# Patient Record
Sex: Male | Born: 1995 | Race: Black or African American | Hispanic: No | Marital: Single | Smoking: Never smoker
Health system: Southern US, Community
[De-identification: ages and names within clinical notes are randomized; demographics above are authoritative.]

## PROBLEM LIST (undated history)

## (undated) DIAGNOSIS — F209 Schizophrenia, unspecified: Secondary | ICD-10-CM

---
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
## 2016-04-19 ENCOUNTER — Ambulatory Visit: Payer: BLUE CROSS/BLUE SHIELD

## 2019-05-09 ENCOUNTER — Ambulatory Visit: Payer: BLUE CROSS/BLUE SHIELD | Admitting: Registered Nurse

## 2019-05-11 ENCOUNTER — Encounter (HOSPITAL_COMMUNITY): Payer: Self-pay | Admitting: Emergency Medicine

## 2019-05-11 ENCOUNTER — Other Ambulatory Visit: Payer: Self-pay

## 2019-05-11 ENCOUNTER — Emergency Department (HOSPITAL_COMMUNITY)
Admission: EM | Admit: 2019-05-11 | Discharge: 2019-05-12 | Disposition: A | Discharge status: Other Acute Inpatient Hospital | Payer: Self-pay | Attending: Emergency Medicine | Admitting: Emergency Medicine

## 2019-05-11 DIAGNOSIS — R45850 Homicidal ideations: Secondary | ICD-10-CM | POA: Insufficient documentation

## 2019-05-11 DIAGNOSIS — F22 Delusional disorders: Secondary | ICD-10-CM

## 2019-05-11 DIAGNOSIS — Z20828 Contact with and (suspected) exposure to other viral communicable diseases: Secondary | ICD-10-CM | POA: Insufficient documentation

## 2019-05-11 DIAGNOSIS — F200 Paranoid schizophrenia: Secondary | ICD-10-CM | POA: Insufficient documentation

## 2019-05-11 LAB — BASIC METABOLIC PANEL
Anion gap: 9 (ref 5–15)
BUN: 10 mg/dL (ref 6–20)
CO2: 23 mmol/L (ref 22–32)
Calcium: 9.5 mg/dL (ref 8.9–10.3)
Chloride: 107 mmol/L (ref 98–111)
Creatinine, Ser: 1.11 mg/dL (ref 0.61–1.24)
GFR calc Af Amer: >60 mL/min (ref >60)
GFR calc non Af Amer: >60 mL/min (ref >60)
Glucose, Bld: 104 mg/dL — ABNORMAL HIGH (ref 70–99)
Potassium: 3.1 mmol/L — ABNORMAL LOW (ref 3.5–5.1)
Sodium: 139 mmol/L (ref 135–145)

## 2019-05-11 LAB — ETHANOL: Alcohol, Ethyl (B): <10 mg/dL (ref <10)

## 2019-05-11 LAB — RAPID URINE DRUG SCREEN, HOSP PERFORMED
Amphetamines: NOT DETECTED
Barbiturates: NOT DETECTED
Benzodiazepines: NOT DETECTED
Cocaine: NOT DETECTED
Opiates: NOT DETECTED
Tetrahydrocannabinol: NOT DETECTED

## 2019-05-11 LAB — CBC WITH DIFFERENTIAL/PLATELET
Abs Immature Granulocytes: 0.02 10*3/uL (ref 0.00–0.07)
Basophils Absolute: 0 10*3/uL (ref 0.0–0.1)
Basophils Relative: 1 %
Eosinophils Absolute: 0.1 10*3/uL (ref 0.0–0.5)
Eosinophils Relative: 1 %
HCT: 43.6 % (ref 39.0–52.0)
Hemoglobin: 14.5 g/dL (ref 13.0–17.0)
Immature Granulocytes: 0 %
Lymphocytes Relative: 11 %
Lymphs Abs: 0.7 10*3/uL (ref 0.7–4.0)
MCH: 32.2 pg (ref 26.0–34.0)
MCHC: 33.3 g/dL (ref 30.0–36.0)
MCV: 96.9 fL (ref 80.0–100.0)
Monocytes Absolute: 0.9 10*3/uL (ref 0.1–1.0)
Monocytes Relative: 14 %
Neutro Abs: 4.6 10*3/uL (ref 1.7–7.7)
Neutrophils Relative %: 73 %
Platelets: 321 10*3/uL (ref 150–400)
RBC: 4.5 MIL/uL (ref 4.22–5.81)
RDW: 11.6 % (ref 11.5–15.5)
WBC: 6.3 10*3/uL (ref 4.0–10.5)
nRBC: 0 % (ref 0.0–0.2)

## 2019-05-11 LAB — SARS CORONAVIRUS 2 BY RT PCR (HOSPITAL ORDER, PERFORMED IN ~~LOC~~ HOSPITAL LAB): SARS Coronavirus 2: NEGATIVE
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
## 2019-05-11 NOTE — ED Notes (Signed)
Patient belongings inventoried and placed in locker#4 

## 2019-05-11 NOTE — ED Notes (Addendum)
TTS completed. Md at bedside. First exam done. Copies made of all paperwork. Copies to medical records, [facility], [facility], [facility] and chart. 3 copies on chart. Original IVC and 1st exam to red folder.

## 2019-05-11 NOTE — ED Notes (Signed)
Patient denies pain and is resting comfortably.  
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
## 2019-05-11 NOTE — BHH Counselor (Signed)
Pt's nurse Candy to find pt's IVC paperwork. Once IVC paperwork is received and reviewed pt's TTS assessment will be completed.    Vertell Novak, [place], [facility], [facility] Triage Specialist [phone]

## 2019-05-11 NOTE — ED Notes (Signed)
TTS in progress 
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
## 2019-05-12 ENCOUNTER — Other Ambulatory Visit: Payer: Self-pay

## 2019-05-12 ENCOUNTER — Inpatient Hospital Stay (HOSPITAL_COMMUNITY)
Admission: AD | Admit: 2019-05-12 | Discharge: 2019-05-22 | DRG: 885 | Disposition: A | Discharge status: Home / Self Care | Payer: BC Managed Care – PPO | Source: Intra-hospital | Attending: Psychiatry | Admitting: Psychiatry

## 2019-05-12 ENCOUNTER — Encounter (HOSPITAL_COMMUNITY): Payer: Self-pay

## 2019-05-12 DIAGNOSIS — F209 Schizophrenia, unspecified: Secondary | ICD-10-CM | POA: Diagnosis present

## 2019-05-12 DIAGNOSIS — F419 Anxiety disorder, unspecified: Secondary | ICD-10-CM | POA: Diagnosis present

## 2019-05-12 DIAGNOSIS — Z1159 Encounter for screening for other viral diseases: Secondary | ICD-10-CM

## 2019-05-12 DIAGNOSIS — R4587 Impulsiveness: Secondary | ICD-10-CM | POA: Diagnosis present

## 2019-05-12 DIAGNOSIS — F2081 Schizophreniform disorder: Principal | ICD-10-CM | POA: Diagnosis present

## 2019-05-12 DIAGNOSIS — F29 Unspecified psychosis not due to a substance or known physiological condition: Secondary | ICD-10-CM

## 2019-05-12 DIAGNOSIS — F200 Paranoid schizophrenia: Secondary | ICD-10-CM | POA: Diagnosis not present

## 2019-05-12 MED ORDER — BENZTROPINE MESYLATE 1 MG PO TABS
1.0000 mg | ORAL_TABLET | Freq: Two times a day (BID) | ORAL | Status: DC
  Administered 2019-05-12 – 2019-05-22 (×12): 1 mg via ORAL
  Filled 2019-05-12 (×6): qty 1
  Filled 2019-05-12: qty 14
  Filled 2019-05-12 (×7): qty 1
  Filled 2019-05-12: qty 14
  Filled 2019-05-12 (×12): qty 1

## 2019-05-12 MED ORDER — RISPERIDONE 3 MG PO TABS
3.0000 mg | ORAL_TABLET | Freq: Two times a day (BID) | ORAL | Status: DC
  Administered 2019-05-12 – 2019-05-14 (×4): 3 mg via ORAL
  Filled 2019-05-12 (×11): qty 1

## 2019-05-12 MED ORDER — OMEGA-3-ACID ETHYL ESTERS 1 G PO CAPS
1.0000 g | ORAL_CAPSULE | Freq: Two times a day (BID) | ORAL | Status: DC
  Administered 2019-05-12 – 2019-05-22 (×10): 1 g via ORAL
  Filled 2019-05-12 (×25): qty 1

## 2019-05-12 MED ORDER — LORAZEPAM 2 MG/ML IJ SOLN: 2.0000 mg | Freq: Four times a day (QID) | INTRAMUSCULAR | Status: DC

## 2019-05-12 MED ORDER — ACETAMINOPHEN 325 MG PO TABS: 650.0000 mg | ORAL_TABLET | Freq: Four times a day (QID) | ORAL | Status: DC | PRN

## 2019-05-12 MED ORDER — TEMAZEPAM 30 MG PO CAPS
30.0000 mg | ORAL_CAPSULE | Freq: Every day | ORAL | Status: DC
  Administered 2019-05-13 – 2019-05-21 (×8): 30 mg via ORAL
  Filled 2019-05-12 (×8): qty 1

## 2019-05-12 MED ORDER — ARIPIPRAZOLE 2 MG PO TABS
2.0000 mg | ORAL_TABLET | Freq: Once | ORAL | Status: AC
  Administered 2019-05-12: 2 mg via ORAL
  Filled 2019-05-12 (×2): qty 1

## 2019-05-12 MED ORDER — COMPLETENATE 29-1 MG PO CHEW
1.0000 | CHEWABLE_TABLET | Freq: Every day | ORAL | Status: DC
  Filled 2019-05-12 (×2): qty 1

## 2019-05-12 MED ORDER — MAGNESIUM HYDROXIDE 400 MG/5ML PO SUSP: 30.0000 mL | Freq: Every day | ORAL | Status: DC | PRN

## 2019-05-12 MED ORDER — ALUM & MAG HYDROXIDE-SIMETH 200-200-20 MG/5ML PO SUSP: 30.0000 mL | ORAL | Status: DC | PRN

## 2019-05-12 MED ORDER — LORAZEPAM 1 MG PO TABS
2.0000 mg | ORAL_TABLET | Freq: Four times a day (QID) | ORAL | Status: DC
  Administered 2019-05-12 – 2019-05-22 (×20): 2 mg via ORAL
  Filled 2019-05-12 (×21): qty 2

## 2019-05-12 MED ORDER — HALOPERIDOL LACTATE 5 MG/ML IJ SOLN: 10.0000 mg | Freq: Four times a day (QID) | INTRAMUSCULAR | Status: DC | PRN

## 2019-05-12 MED ORDER — HYDROXYZINE HCL 25 MG PO TABS
25.0000 mg | ORAL_TABLET | Freq: Three times a day (TID) | ORAL | Status: DC | PRN
  Administered 2019-05-19 – 2019-05-21 (×4): 25 mg via ORAL
  Filled 2019-05-12 (×4): qty 1

## 2019-05-12 MED ORDER — TRAZODONE HCL 50 MG PO TABS
50.0000 mg | ORAL_TABLET | Freq: Every evening | ORAL | Status: DC | PRN
  Administered 2019-05-16 – 2019-05-21 (×5): 50 mg via ORAL
  Filled 2019-05-12 (×5): qty 1

## 2019-05-12 NOTE — Progress Notes (Signed)
Pt accepted to [facility], Bed 500-1 Ricky Ala NP is the accepting provider.  Johnn Hai, MD is the attending provider.  Call report to [phone]  Cindy @ [facility] ED notified.   Pt is IVC .  Pt may be transported by  [facility] Pt scheduled  to arrive at [facility] as soon as transport can be arranged.  Areatha Keas. Judi Cong, MSW, [place] Disposition Clinical Social Work [phone] (cell) [phone] (office)
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
## 2019-05-12 NOTE — Progress Notes (Signed)
Admission Note: Patient is a 23 year old male who is admitted to the unit for symptoms of hallucinations, paranoia, and medication noncompliance.  Patient is alert and oriented to person, place and time.  Presents with flat affect and paranoid mood.  Patient observed responding to internal stimuli.  Patient appears guarded and suspicious.  Forward little during assessment with period of thought blocking.  Admission  Plan of care reviewed and consent signed.  Skin assessment and personal belongings completed.  Skin is dry and intact.  No contraband found.  Patient oriented to the unit, room and staff.  Routine safety checks initiated.  Verbalizes understanding of unit rules and protocols.  Patient is safe on the unit.

## 2019-05-12 NOTE — ED Notes (Addendum)
Spoke with pt's father (Approved by son) Explained to him he would be admitted [facility] inpatient  Also gave pt's father the [facility]

## 2019-05-12 NOTE — Tx Team (Signed)
Initial Treatment Plan 05/12/2019 5:34 PM [name] [name] [id number]    PATIENT STRESSORS: Legal issue Marital or family conflict Medication change or noncompliance   PATIENT STRENGTHS: Average or above average intelligence Communication skills Supportive family/friends   PATIENT IDENTIFIED PROBLEMS: Medication Noncompliance  Paranoia  Hallucinations                 DISCHARGE CRITERIA:  Adequate post-discharge living arrangements Motivation to continue treatment in a less acute level of care  PRELIMINARY DISCHARGE PLAN: Attend aftercare/continuing care group Outpatient therapy Return to previous living arrangement  PATIENT/FAMILY INVOLVEMENT: This treatment plan has been presented to and reviewed with the patient, [name] [name], and/or family member.  The patient and family have been given the opportunity to ask questions and make suggestions.  Vela Prose, RN 05/12/2019, 5:34 PM
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
## 2019-05-12 NOTE — ED Notes (Signed)
Breakfast tray ordered 
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
## 2019-05-12 NOTE — Progress Notes (Signed)
Pt currently asleep in bed. Respiration are even and unlabored. Pt in no sign of distress. Will continue to monitor.    [place] NOVEL CORONAVIRUS (COVID-19) DAILY CHECK-OFF SYMPTOMS - answer yes or no to each - every day NO YES  Have you had a fever in the past 24 hours?  . Fever (Temp > 37.80C / 100F) X   Have you had any of these symptoms in the past 24 hours? . New Cough .  Sore Throat  .  Shortness of Breath .  Difficulty Breathing .  Unexplained Body Aches   X   Have you had any one of these symptoms in the past 24 hours not related to allergies?   . Runny Nose .  Nasal Congestion .  Sneezing   X   If you have had runny nose, nasal congestion, sneezing in the past 24 hours, has it worsened?  X   EXPOSURES - check yes or no X   Have you traveled outside the state in the past 14 days?  X   Have you been in contact with someone with a confirmed diagnosis of COVID-19 or PUI in the past 14 days without wearing appropriate PPE?  X   Have you been living in the same home as a person with confirmed diagnosis of COVID-19 or a PUI (household contact)?    X   Have you been diagnosed with COVID-19?    X              What to do next: Answered NO to all: Answered YES to anything:   Proceed with unit schedule Follow the [facility] Inpatient Flowsheet.    
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
## 2019-05-12 NOTE — ED Notes (Signed)
Arranged transportation with [facility] department.

## 2019-05-13 LAB — HEMOGLOBIN A1C
Hgb A1c MFr Bld: 4 % — ABNORMAL LOW (ref 4.8–5.6)
Mean Plasma Glucose: 68.1 mg/dL

## 2019-05-13 LAB — CBC
HCT: 41.4 % (ref 39.0–52.0)
Hemoglobin: 13.2 g/dL (ref 13.0–17.0)
MCH: 31.8 pg (ref 26.0–34.0)
MCHC: 31.9 g/dL (ref 30.0–36.0)
MCV: 99.8 fL (ref 80.0–100.0)
Platelets: 275 10*3/uL (ref 150–400)
RBC: 4.15 MIL/uL — ABNORMAL LOW (ref 4.22–5.81)
RDW: 11.6 % (ref 11.5–15.5)
WBC: 3 10*3/uL — ABNORMAL LOW (ref 4.0–10.5)
nRBC: 0 % (ref 0.0–0.2)

## 2019-05-13 LAB — COMPREHENSIVE METABOLIC PANEL
ALT: 13 U/L (ref 0–44)
AST: 18 U/L (ref 15–41)
Albumin: 4 g/dL (ref 3.5–5.0)
Alkaline Phosphatase: 48 U/L (ref 38–126)
Anion gap: 6 (ref 5–15)
BUN: 14 mg/dL (ref 6–20)
CO2: 27 mmol/L (ref 22–32)
Calcium: 8.8 mg/dL — ABNORMAL LOW (ref 8.9–10.3)
Chloride: 107 mmol/L (ref 98–111)
Creatinine, Ser: 1.07 mg/dL (ref 0.61–1.24)
GFR calc Af Amer: >60 mL/min (ref >60)
GFR calc non Af Amer: >60 mL/min (ref >60)
Glucose, Bld: 100 mg/dL — ABNORMAL HIGH (ref 70–99)
Potassium: 3.7 mmol/L (ref 3.5–5.1)
Sodium: 140 mmol/L (ref 135–145)
Total Bilirubin: 0.6 mg/dL (ref 0.3–1.2)
Total Protein: 6.7 g/dL (ref 6.5–8.1)

## 2019-05-13 LAB — LIPID PANEL
Cholesterol: 180 mg/dL (ref 0–200)
HDL: 45 mg/dL (ref >40)
LDL Cholesterol: 127 mg/dL — ABNORMAL HIGH (ref 0–99)
Total CHOL/HDL Ratio: 4 RATIO
Triglycerides: 38 mg/dL (ref <150)
VLDL: 8 mg/dL (ref 0–40)

## 2019-05-13 LAB — TSH: TSH: 0.637 u[IU]/mL (ref 0.350–4.500)

## 2019-05-13 MED ORDER — PRENATAL MULTIVITAMIN CH
1.0000 | ORAL_TABLET | Freq: Every day | ORAL | Status: DC
  Administered 2019-05-13 – 2019-05-20 (×5): 1 via ORAL
  Filled 2019-05-13 (×12): qty 1

## 2019-05-13 NOTE — [facility] Counselor (Signed)
CSW went into the pt's room for the assessment. CSW called out pt's name and asked him if they could have the assessment. Pt was asleep and woke up and stated, "yes". CSW tried to start the assessment but pt fell back asleep.
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
## 2019-05-13 NOTE — Progress Notes (Signed)
Recreation Therapy Notes    Date: 05/13/2019 Time: 10:00 am Location: 500 hall   Group Topic: Life Goal Planning  Goal Area(s) Addresses:  Patient will work on worksheet on  Life Goal Planning. Patient will follow directions on first prompt.  Behavioral Response: Appropriate  Intervention: Worksheet  Activity:  Staff on 500 hall were provided with a worksheet on Life Goal Planning . Staff was instructed to give it to the patients and have them work on it in place of Recreation Therapy Group. Staff was also given 2 coloring sheets and 2 word searches and were given the option to give them out.  Education: Ability to follow Directions, Change of thought processes Discharge Planning.   Education Outcome: Acknowledges education/In group clarification offered  Clinical Observations/Feedback: . Due to COVID-19,guidelines group was not held.Group members were provided a learning activity worksheet to work on the topic and above-stated goals. LRT is available to answer any questions patient may have regarding the worksheet.  Creedon Danielski L Vlad Mayberry, LRT/CTRS       Del Overfelt L Yulonda Wheeling 05/13/2019 2:45 PM 

## 2019-05-13 NOTE — Progress Notes (Signed)
[facility] Progress [facility]  05/13/2019 7:56 AM [name] MinorsLancine Climer  MRN:  [id number] Subjective:    Patient remains vague on exam, unable to reach family members still.  Patient himself elaborated that he did see people "in the kitchen" but when asked if they were some type of hallucination he thinks they might be "neighbors" but he cannot describe them.  When asked if he is hearing things he denies this.  So again he has some negative symptoms as well as positive symptoms.  Principal Problem: New onset psychosis Diagnosis: Active Problems:   Psychosis (HCC)   Schizophrenia (HCC)  Total Time spent with patient: 20 minutes  Past Psychiatric History: neg  Past Medical History: History reviewed. No pertinent past medical history. History reviewed. No pertinent surgical history. Family History: History reviewed. No pertinent family history. Family Psychiatric  History: ukn Social History:  Social History   Substance and Sexual Activity  Alcohol Use No     Social History   Substance and Sexual Activity  Drug Use No    Social History   Socioeconomic History  . Marital status: Single    Spouse name: Not on file  . Number of children: Not on file  . Years of education: Not on file  . Highest education level: Not on file  Occupational History  . Not on file  Social Needs  . Financial resource strain: Not on file  . Food insecurity:    Worry: Not on file    Inability: Not on file  . Transportation needs:    Medical: Not on file    Non-medical: Not on file  Tobacco Use  . Smoking status: Never Smoker  . Smokeless tobacco: Never Used  Substance and Sexual Activity  . Alcohol use: No  . Drug use: No  . Sexual activity: Never  Lifestyle  . Physical activity:    Days per week: Not on file    Minutes per session: Not on file  . Stress: Not on file  Relationships  . Social connections:    Talks on phone: Not on file    Gets together: Not on file    Attends religious service: Not on  file    Active member of club or organization: Not on file    Attends meetings of clubs or organizations: Not on file    Relationship status: Not on file  Other Topics Concern  . Not on file  Social History Narrative  . Not on file   Additional Social History:                         Sleep: Good  Appetite:  Good  Current Medications: Current Facility-Administered Medications  Medication Dose Route Frequency Provider Last Rate Last Dose  . acetaminophen (TYLENOL) tablet 650 mg  650 mg Oral Q6H PRN Oneta RackLewis, Tanika N, NP      . alum & mag hydroxide-simeth (MAALOX/MYLANTA) 200-200-20 MG/5ML suspension 30 mL  30 mL Oral Q4H PRN Oneta RackLewis, Tanika N, NP      . benztropine (COGENTIN) tablet 1 mg  1 mg Oral BID Malvin JohnsFarah, Darice Vicario, MD   1 mg at 05/12/19 1646  . haloperidol lactate (HALDOL) injection 10 mg  10 mg Intramuscular Q6H PRN Malvin JohnsFarah, Nicky Milhouse, MD      . hydrOXYzine (ATARAX/VISTARIL) tablet 25 mg  25 mg Oral TID PRN Oneta RackLewis, Tanika N, NP      . LORazepam (ATIVAN) tablet 2 mg  2 mg Oral Q6H  Malvin JohnsFarah, Tenelle Andreason, MD   2 mg at 05/12/19 1645   Or  . LORazepam (ATIVAN) injection 2 mg  2 mg Intramuscular Q6H Malvin JohnsFarah, Martice Doty, MD      . magnesium hydroxide (MILK OF MAGNESIA) suspension 30 mL  30 mL Oral Daily PRN Oneta RackLewis, Tanika N, NP      . omega-3 acid ethyl esters (LOVAZA) capsule 1 g  1 g Oral BID Malvin JohnsFarah, Dillinger Aston, MD   1 g at 05/12/19 1646  . prenatal vitamin w/FE, FA (NATACHEW) chewable tablet 1 tablet  1 tablet Oral Q1200 Malvin JohnsFarah, Brax Walen, MD      . risperiDONE (RISPERDAL) tablet 3 mg  3 mg Oral BID Malvin JohnsFarah, Guage Efferson, MD   3 mg at 05/12/19 1645  . temazepam (RESTORIL) capsule 30 mg  30 mg Oral QHS Malvin JohnsFarah, Brandie Lopes, MD      . traZODone (DESYREL) tablet 50 mg  50 mg Oral QHS PRN Oneta RackLewis, Tanika N, NP        Lab Results:  Results for orders placed or performed during the hospital encounter of 05/11/19 (from the past 48 hour(s))  Urine rapid drug screen (hosp performed)     Status: None   Collection Time: 05/11/19  8:30 PM   Result Value Ref Range   Opiates NONE DETECTED NONE DETECTED   Cocaine NONE DETECTED NONE DETECTED   Benzodiazepines NONE DETECTED NONE DETECTED   Amphetamines NONE DETECTED NONE DETECTED   Tetrahydrocannabinol NONE DETECTED NONE DETECTED   Barbiturates NONE DETECTED NONE DETECTED    Comment: (NOTE) DRUG SCREEN FOR MEDICAL PURPOSES ONLY.  IF CONFIRMATION IS NEEDED FOR ANY PURPOSE, NOTIFY LAB WITHIN 5 DAYS. LOWEST DETECTABLE LIMITS FOR URINE DRUG SCREEN Drug Class                     Cutoff (ng/mL) Amphetamine and metabolites    1000 Barbiturate and metabolites    200 Benzodiazepine                 200 Tricyclics and metabolites     300 Opiates and metabolites        300 Cocaine and metabolites        300 THC                            50 Performed at [facility], [address] [place], [place] [address]   Basic metabolic panel     Status: Abnormal   Collection Time: 05/11/19  8:50 PM  Result Value Ref Range   Sodium 139 135 - 145 mmol/L   Potassium 3.1 (L) 3.5 - 5.1 mmol/L   Chloride 107 98 - 111 mmol/L   CO2 23 22 - 32 mmol/L   Glucose, Bld 104 (H) 70 - 99 mg/dL   BUN 10 6 - 20 mg/dL   Creatinine, Ser 6.041.11 0.61 - 1.24 mg/dL   Calcium 9.5 8.9 - 54.010.3 mg/dL   GFR calc non Af Amer >60 >60 mL/min   GFR calc Af Amer >60 >60 mL/min   Anion gap 9 5 - 15    Comment: Performed at [facility], [address] [place], [place] [address]  CBC with Differential     Status: None   Collection Time: 05/11/19  8:50 PM  Result Value Ref Range   WBC 6.3 4.0 - 10.5 K/uL   RBC 4.50 4.22 - 5.81 MIL/uL   Hemoglobin 14.5 13.0 - 17.0 g/dL  HCT 43.6 39.0 - 52.0 %   MCV 96.9 80.0 - 100.0 fL   MCH 32.2 26.0 - 34.0 pg   MCHC 33.3 30.0 - 36.0 g/dL   RDW 04.511.6 40.911.5 - 81.115.5 %   Platelets 321 150 - 400 K/uL   nRBC 0.0 0.0 - 0.2 %   Neutrophils Relative % 73 %   Neutro Abs 4.6 1.7 - 7.7 K/uL   Lymphocytes Relative 11 %   Lymphs Abs 0.7 0.7 - 4.0 K/uL   Monocytes Relative 14 %    Monocytes Absolute 0.9 0.1 - 1.0 K/uL   Eosinophils Relative 1 %   Eosinophils Absolute 0.1 0.0 - 0.5 K/uL   Basophils Relative 1 %   Basophils Absolute 0.0 0.0 - 0.1 K/uL   Immature Granulocytes 0 %   Abs Immature Granulocytes 0.02 0.00 - 0.07 K/uL    Comment: Performed at [facility], [address] [place], [place] [address]  Ethanol     Status: None   Collection Time: 05/11/19  8:50 PM  Result Value Ref Range   Alcohol, Ethyl (B) <10 <10 mg/dL    Comment: (NOTE) Lowest detectable limit for serum alcohol is 10 mg/dL. For medical purposes only. Performed at [facility], [address] [place], [place] [address]   SARS Coronavirus 2 (CEPHEID - Performed in [facility] hospital lab), Hosp Order     Status: None   Collection Time: 05/11/19  9:39 PM  Result Value Ref Range   SARS Coronavirus 2 NEGATIVE NEGATIVE    Comment: (NOTE) If result is NEGATIVE SARS-CoV-2 target nucleic acids are NOT DETECTED. The SARS-CoV-2 RNA is generally detectable in upper and lower  respiratory specimens during the acute phase of infection. The lowest  concentration of SARS-CoV-2 viral copies this assay can detect is 250  copies / mL. A negative result does not preclude SARS-CoV-2 infection  and should not be used as the sole basis for treatment or other  patient management decisions.  A negative result may occur with  improper specimen collection / handling, submission of specimen other  than nasopharyngeal swab, presence of viral mutation(s) within the  areas targeted by this assay, and inadequate number of viral copies  (<250 copies / mL). A negative result must be combined with clinical  observations, patient history, and epidemiological information. If result is POSITIVE SARS-CoV-2 target nucleic acids are DETECTED. The SARS-CoV-2 RNA is generally detectable in upper and lower  respiratory specimens dur ing the acute phase of infection.  Positive  results are indicative of  active infection with SARS-CoV-2.  Clinical  correlation with patient history and other diagnostic information is  necessary to determine patient infection status.  Positive results do  not rule out bacterial infection or co-infection with other viruses. If result is PRESUMPTIVE POSTIVE SARS-CoV-2 nucleic acids MAY BE PRESENT.   A presumptive positive result was obtained on the submitted specimen  and confirmed on repeat testing.  While 2019 novel coronavirus  (SARS-CoV-2) nucleic acids may be present in the submitted sample  additional confirmatory testing may be necessary for epidemiological  and / or clinical management purposes  to differentiate between  SARS-CoV-2 and other Sarbecovirus currently known to infect humans.  If clinically indicated additional testing with an alternate test  methodology [phone]) is advised. The SARS-CoV-2 RNA is generally  detectable in upper and lower respiratory sp ecimens during the acute  phase of infection. The expected result is Negative. Fact Sheet for Patients:  StrictlyIdeas.no Fact Sheet for Healthcare Providers: BankingDealers.co.za This test is not yet approved or cleared by the [place] FDA and has been authorized for detection and/or diagnosis of SARS-CoV-2 by FDA under an Emergency Use Authorization (EUA).  This EUA will remain in effect (meaning this test can be used) for the duration of the COVID-19 declaration under Section 564(b)(1) of the Act, 21 U.S.C. section 360bbb-3(b)(1), unless the authorization is terminated or revoked sooner. Performed at [facility], [place] [address] [place], [place] [address]     Blood Alcohol level:  Lab Results  Component Value Date   ETH <10 06/02/1600    Metabolic Disorder Labs: No results found for: HGBA1C, MPG No results found for: PROLACTIN No results found for: CHOL, TRIG, HDL, CHOLHDL, VLDL, LDLCALC  Physical Findings: AIMS:   , ,  ,  ,    CIWA:    COWS:     Musculoskeletal: Strength & Muscle Tone: within normal limits Gait & Station: normal Patient leans: N/A  Psychiatric Specialty Exam: Physical Exam  ROS  Blood pressure 106/71, pulse 79, temperature 97.9 F (36.6 C), temperature source Oral, resp. rate 16, height 6' (1.829 m), weight 72.6 kg, SpO2 99 %.Body mass index is 21.7 kg/m.  General Appearance: Casual  Eye Contact:  Fair  Speech:  Clear and Coherent  Volume:  Decreased  Mood:  Euthymic  Affect:  Restricted  Thought Process:  Linear and Descriptions of Associations: Circumstantial  Orientation:  Full (Time, Place, and Person)  Thought Content:  Illogical and Tangential  Suicidal Thoughts:  No  Homicidal Thoughts:  No  Memory:  Immediate;   Fair  Judgement:  Fair  Insight:  Shallow  Psychomotor Activity:  Normal  Concentration:  Concentration: Fair  Recall:  [employer] of Knowledge:  Fair  Language:  Fair  Akathisia:  Negative  Handed:  Right  AIMS (if indicated):     Assets:  Housing Leisure Time Physical Health Resilience Social Support  ADL's:  Intact  Cognition:  WNL  Sleep:  Number of Hours: 5.75     Treatment Plan Summary: Daily contact with patient to assess and evaluate symptoms and progress in treatment, Medication management and Plan To new neuroprotective measures continue antipsychotic therapy encourage compliance discussed with team continue to try and contact family and seek diagnostic clarity  Zeynep Fantroy, MD 05/13/2019, 7:56 AM

## 2019-05-13 NOTE — [facility] Counselor (Signed)
CSW tried to complete assessment but pt was still asleep.

## 2019-05-13 NOTE — Progress Notes (Signed)
Nursing Progress Note: 7p-7a D: Pt currently presents with a flirtatious/inappropriate/intrusive/wandering affect and behavior. Pt states to writer, "Come back to my room so we can talk or do something else." Pt redirected. Interacting intrusively with the milieu. Pt seen wandering into male patients' room. Pt reports too much sleep during the previous night with current medication regimen.   A: Pt wandering into other male patients room. Pt educated and redirectedPt provided with medications per providers orders. Pt's labs and vitals were monitored throughout the night. Pt supported emotionally and encouraged to express concerns and questions. Pt educated on medications.  R: Pt's safety ensured with 15 minute and environmental checks. Pt currently denies SI, HI, and AVH. Pt verbally contracts to seek staff if SI,HI, or AVH occurs and to consult with staff before acting on any harmful thoughts. Will continue to monitor.  [place] NOVEL CORONAVIRUS (COVID-19) DAILY CHECK-OFF SYMPTOMS - answer yes or no to each - every day NO YES  Have you had a fever in the past 24 hours?  . Fever (Temp > 37.80C / 100F) X   Have you had any of these symptoms in the past 24 hours? . New Cough .  Sore Throat  .  Shortness of Breath .  Difficulty Breathing .  Unexplained Body Aches   X   Have you had any one of these symptoms in the past 24 hours not related to allergies?   . Runny Nose .  Nasal Congestion .  Sneezing   X   If you have had runny nose, nasal congestion, sneezing in the past 24 hours, has it worsened?  X   EXPOSURES - check yes or no X   Have you traveled outside the state in the past 14 days?  X   Have you been in contact with someone with a confirmed diagnosis of COVID-19 or PUI in the past 14 days without wearing appropriate PPE?  X   Have you been living in the same home as a person with confirmed diagnosis of COVID-19 or a PUI (household contact)?    X   Have you been diagnosed  with COVID-19?    X              What to do next: Answered NO to all: Answered YES to anything:   Proceed with unit schedule Follow the [facility] Inpatient Flowsheet.

## 2019-05-14 NOTE — Progress Notes (Signed)
D:  Patient stated he does hear voices.  Denied visual hallucinations.  Denied SI and HI, contracts for safety.  Denied A/V hallucinations.   A:  Medications administered per MD orders.  Emotional support and encouragement given patient. R:  Safety maintained with 15 minute checks.

## 2019-05-14 NOTE — Progress Notes (Signed)
Recreation Therapy Notes     Date:05/14/2019 Time:10:00 am Location:500 hall   Group Topic:Labeling Triggers  Goal Area(s) Addresses: Patient will work on worksheet on Labeling Triggers . Patient will follow directions on first prompt.  Behavioral Response:Appropriate  Intervention:Worksheet  Activity: Staff on 500 hall were provided with a worksheet on Labeling Triggers. Staff was instructed to give it to the patients and have them work on it in place of Recreation Therapy Group. Staff was also given 2 coloring sheets and 2 word searches and were given the option to give them out.  Education:Ability to follow Directions, Change of thought processes Discharge Planning.   Education Outcome:Acknowledges education/In group clarification offered  Clinical Observations/Feedback:. Due to COVID-19,guidelines group was not held.Group members were provided a learning activity worksheet to work on the topic and above-stated goals. LRT is available to answer any questions patient may have regarding the worksheet.  Kunta Hilleary L Angella Montas, LRT/CTRS      Wyley Hack L Lavayah Vita 05/14/2019 10:03 AM 

## 2019-05-14 NOTE — Plan of Care (Signed)
Nurse discussed anxiety and coping skills with patient. 

## 2019-05-14 NOTE — [facility] Counselor (Signed)
CSW spoke with pt about doing his assessment. CSW asked the pt if he wanted to complete the assessment. Pt asked what the assessment was about. CSW let the pt know that it was gathering/obtaining information to assess with getting some aftercare set up. Pt stated, "I don't want to do the assessment". Pt refused to ask any questions.

## 2019-05-14 NOTE — Tx Team (Addendum)
Interdisciplinary Treatment and Diagnostic Plan Update  05/14/2019 Time of Session: 09:10am [name] [name] MRN: [id number]  Principal Diagnosis: <principal problem not specified>  Secondary Diagnoses: Active Problems:   Psychosis (HCC)   Schizophrenia (HCC)   Current Medications:  Current Facility-Administered Medications  Medication Dose Route Frequency Provider Last Rate Last Dose  . acetaminophen (TYLENOL) tablet 650 mg  650 mg Oral Q6H PRN Oneta RackLewis, Tanika N, NP      . alum & mag hydroxide-simeth (MAALOX/MYLANTA) 200-200-20 MG/5ML suspension 30 mL  30 mL Oral Q4H PRN Oneta RackLewis, Tanika N, NP      . benztropine (COGENTIN) tablet 1 mg  1 mg Oral BID Malvin JohnsFarah, Brian, MD   1 mg at 05/14/19 0846  . haloperidol lactate (HALDOL) injection 10 mg  10 mg Intramuscular Q6H PRN Malvin JohnsFarah, Brian, MD      . hydrOXYzine (ATARAX/VISTARIL) tablet 25 mg  25 mg Oral TID PRN Oneta RackLewis, Tanika N, NP      . LORazepam (ATIVAN) tablet 2 mg  2 mg Oral Q6H Malvin JohnsFarah, Brian, MD   2 mg at 05/14/19 0848   Or  . LORazepam (ATIVAN) injection 2 mg  2 mg Intramuscular Q6H Malvin JohnsFarah, Brian, MD      . magnesium hydroxide (MILK OF MAGNESIA) suspension 30 mL  30 mL Oral Daily PRN Oneta RackLewis, Tanika N, NP      . omega-3 acid ethyl esters (LOVAZA) capsule 1 g  1 g Oral BID Malvin JohnsFarah, Brian, MD   1 g at 05/14/19 0846  . prenatal multivitamin tablet 1 tablet  1 tablet Oral Daily Malvin JohnsFarah, Brian, MD   1 tablet at 05/14/19 0846  . risperiDONE (RISPERDAL) tablet 3 mg  3 mg Oral BID Malvin JohnsFarah, Brian, MD   3 mg at 05/14/19 0847  . temazepam (RESTORIL) capsule 30 mg  30 mg Oral QHS Malvin JohnsFarah, Brian, MD   30 mg at 05/13/19 2114  . traZODone (DESYREL) tablet 50 mg  50 mg Oral QHS PRN Oneta RackLewis, Tanika N, NP       PTA Medications: No medications prior to admission.    Patient Stressors: Legal issue Marital or family conflict Medication change or noncompliance  Patient Strengths: Average or above average intelligence Communication skills Supportive  family/friends  Treatment Modalities: Medication Management, Group therapy, Case management,  1 to 1 session with clinician, Psychoeducation, Recreational therapy.   Physician Treatment Plan for Primary Diagnosis: <principal problem not specified> Long Term Goal(s): Improvement in symptoms so as ready for discharge Improvement in symptoms so as ready for discharge   Short Term Goals: Ability to disclose and discuss suicidal ideas Ability to demonstrate self-control will improve Ability to identify and develop effective coping behaviors will improve Ability to demonstrate self-control will improve Ability to maintain clinical measurements within normal limits will improve  Medication Management: Evaluate patient's response, side effects, and tolerance of medication regimen.  Therapeutic Interventions: 1 to 1 sessions, Unit Group sessions and Medication administration.  Evaluation of Outcomes: Not Progressing  Physician Treatment Plan for Secondary Diagnosis: Active Problems:   Psychosis (HCC)   Schizophrenia (HCC)  Long Term Goal(s): Improvement in symptoms so as ready for discharge Improvement in symptoms so as ready for discharge   Short Term Goals: Ability to disclose and discuss suicidal ideas Ability to demonstrate self-control will improve Ability to identify and develop effective coping behaviors will improve Ability to demonstrate self-control will improve Ability to maintain clinical measurements within normal limits will improve     Medication Management: Evaluate patient's response, side effects,  and tolerance of medication regimen.  Therapeutic Interventions: 1 to 1 sessions, Unit Group sessions and Medication administration.  Evaluation of Outcomes: Not Progressing   RN Treatment Plan for Primary Diagnosis: <principal problem not specified> Long Term Goal(s): Knowledge of disease and therapeutic regimen to maintain health will improve  Short Term Goals:  Ability to participate in decision making will improve, Ability to verbalize feelings will improve, Ability to disclose and discuss suicidal ideas, Ability to identify and develop effective coping behaviors will improve and Compliance with prescribed medications will improve  Medication Management: RN will administer medications as ordered by provider, will assess and evaluate patient's response and provide education to patient for prescribed medication. RN will report any adverse and/or side effects to prescribing provider.  Therapeutic Interventions: 1 on 1 counseling sessions, Psychoeducation, Medication administration, Evaluate responses to treatment, Monitor vital signs and CBGs as ordered, Perform/monitor CIWA, COWS, AIMS and Fall Risk screenings as ordered, Perform wound care treatments as ordered.  Evaluation of Outcomes: Not Progressing   LCSW Treatment Plan for Primary Diagnosis: <principal problem not specified> Long Term Goal(s): Safe transition to appropriate next level of care at discharge, Engage patient in therapeutic group addressing interpersonal concerns.  Short Term Goals: Engage patient in aftercare planning with referrals and resources and Increase skills for wellness and recovery  Therapeutic Interventions: Assess for all discharge needs, 1 to 1 time with Social worker, Explore available resources and support systems, Assess for adequacy in community support network, Educate family and significant other(s) on suicide prevention, Complete Psychosocial Assessment, Interpersonal group therapy.  Evaluation of Outcomes: Not Progressing   Progress in Treatment: Attending groups: No. Participating in groups: No. Taking medication as prescribed: Yes. Toleration medication: Yes. Family/Significant other contact made: No, will contact:  will contact when given permission to contact Patient understands diagnosis: No. Discussing patient identified problems/goals with staff:  Yes. Medical problems stabilized or resolved: Yes. Denies suicidal/homicidal ideation: Yes. Issues/concerns per patient self-inventory: No. Other:   New problem(s) identified: No, Describe:  None  New Short Term/Long Term Goal(s):  Patient Goals:  "To not see or hear things"  Discharge Plan or Barriers:   Reason for Continuation of Hospitalization: Aggression Medication stabilization  Estimated Length of Stay: 2-3 days  Attendees: Patient: [name] [name] 05/14/2019   Physician: Dr. Johnn Hai, MD 05/14/2019  Nursing: Rise Paganini, RN 05/14/2019   RN Care Manager: 05/14/2019   Social Worker: Ardelle Anton, LCSW 05/14/2019   Recreational Therapist:  05/14/2019   Other:  05/14/2019   Other:  05/14/2019   Other: 05/14/2019     Scribe for Treatment Team: Trecia Rogers, LCSW 05/14/2019 10:54 AM
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
## 2019-05-15 DIAGNOSIS — F200 Paranoid schizophrenia: Secondary | ICD-10-CM

## 2019-05-15 LAB — PROLACTIN: Prolactin: 37.5 ng/mL — ABNORMAL HIGH (ref 4.0–15.2)

## 2019-05-15 MED ORDER — RISPERIDONE 2 MG PO TBDP
6.0000 mg | ORAL_TABLET | Freq: Every day | ORAL | Status: DC
  Administered 2019-05-15 – 2019-05-21 (×7): 6 mg via ORAL
  Filled 2019-05-15 (×9): qty 3

## 2019-05-15 NOTE — Progress Notes (Addendum)
Arvine was woken up for assessment. Pt denies SI/HI/AVH at this time. Pt took scheduled meds. Pt appears to be responding to internal stimuli; laughing inappropriately.Pt stays isolative to room. Support offered. Will continue with POC.

## 2019-05-15 NOTE — Progress Notes (Signed)
Recreation Therapy Notes  Date: 05/14/2019 Time: 10:00 am Location: 500 hall   Group Topic: Self Esteem, Strengths and Qualities  Goal Area(s) Addresses:  Patient will work on worksheet on Self Esteem, Strengths and Qualities. Patient will follow directions on first prompt.  Behavioral Response: Appropriate  Intervention: Worksheet  Activity:  Staff on 500 hall were provided with a worksheet on Self Esteem, Strengths and Qualities. Staff was instructed to give it to the patients and have them work on it in place of Recreation Therapy Group. Staff was also given 2 coloring sheets and one word search and were given the option to give them out.  Education:  Ability to follow Directions, Change of thought processes Discharge Planning.   Education Outcome: Acknowledges education/In group clarification offered  Clinical Observations/Feedback: . Due to COVID-19, guidelines group was not held. Group members were provided a learning activity worksheet to work on the topic and above-stated goals. LRT is available to answer any questions patient may have regarding the worksheet.  Melburn Treiber L Tyona Nilsen, LRT/CTRS         Janyce Ellinger L Malic Rosten 05/15/2019 12:04 PM 

## 2019-05-15 NOTE — Plan of Care (Signed)
D: Patient is asleep till about 0030h. Patient woke and is alert and bizarre. Denies SI, HI, AVH, and verbally contracts for safety. Patient needed much encouragement to take scheduled medications but did eventually do so. Patient denies physical symptoms/pain. Patient was told many times it is expected for him to stay in his room unit wake up time. Before he fell back asleep patient needed much redirection to not look into or go into anyone else's room. Once he heard the [facility] say "you may not go in there" and he stepped into someone else's room anyway. The other patient remained asleep and Masai was directed back to his own room.   A: Medications administered per MD order. Support provided. Patient educated on safety on the unit and medications. Routine safety checks every 15 minutes. Patient stated understanding to tell nurse about any new physical symptoms. Patient understands to tell staff of any needs.     R: No adverse drug reactions noted. Patient verbally contracts for safety. Patient remains safe at this time and will continue to monitor.   Problem: Education: Goal: Knowledge of [facility] Education information/materials will improve Outcome: Progressing   Patient is oriented to the unit. Patient remains safe and will continue to monitor.   [place] NOVEL CORONAVIRUS (COVID-19) DAILY CHECK-OFF SYMPTOMS - answer yes or no to each - every day NO YES  Have you had a fever in the past 24 hours?  Fever (Temp > 37.80C / 100F) X   Have you had any of these symptoms in the past 24 hours? New Cough  Sore Throat   Shortness of Breath  Difficulty Breathing  Unexplained Body Aches   X   Have you had any one of these symptoms in the past 24 hours not related to allergies?   Runny Nose  Nasal Congestion  Sneezing   X   If you have had runny nose, nasal congestion, sneezing in the past 24 hours, has it worsened?  X   EXPOSURES - check yes or no X   Have you traveled outside the  state in the past 14 days?  X   Have you been in contact with someone with a confirmed diagnosis of COVID-19 or PUI in the past 14 days without wearing appropriate PPE?  X   Have you been living in the same home as a person with confirmed diagnosis of COVID-19 or a PUI (household contact)?    X   Have you been diagnosed with COVID-19?    X              What to do next: Answered NO to all: Answered YES to anything:   Proceed with unit schedule Follow the [facility] Inpatient Flowsheet.

## 2019-05-15 NOTE — Progress Notes (Signed)
[facility] Progress Note  05/15/2019 2:58 PM [name] [name]  MRN:  [id number] Subjective:    Patient continues to deny illness, and says that he did not have true hallucinations and focus on discharge only, yet we elaborate that his father is unprepared to take him back and due to the dangerousness he has posed.  Patient denies current psychotic symptoms lobbyist for discharge argumentative and stating he does not need medications and will not take them.  Principal Problem: Schizophrenia, prominent negative symptoms, lack of insight, volatility at home Diagnosis: Active Problems:   Psychosis (HCC)   Schizophrenia (HCC)  Total Time spent with patient: 20 minutes  Past Medical History: History reviewed. No pertinent past medical history. History reviewed. No pertinent surgical history. Family History: History reviewed. No pertinent family history.  Social History:  Social History   Substance and Sexual Activity  Alcohol Use No     Social History   Substance and Sexual Activity  Drug Use No    Social History   Socioeconomic History  . Marital status: Single    Spouse name: Not on file  . Number of children: Not on file  . Years of education: Not on file  . Highest education level: Not on file  Occupational History  . Not on file  Social Needs  . Financial resource strain: Not on file  . Food insecurity    Worry: Not on file    Inability: Not on file  . Transportation needs    Medical: Not on file    Non-medical: Not on file  Tobacco Use  . Smoking status: Never Smoker  . Smokeless tobacco: Never Used  Substance and Sexual Activity  . Alcohol use: No  . Drug use: No  . Sexual activity: Never  Lifestyle  . Physical activity    Days per week: Not on file    Minutes per session: Not on file  . Stress: Not on file  Relationships  . Social Musicianconnections    Talks on phone: Not on file    Gets together: Not on file    Attends religious service: Not on file    Active member  of club or organization: Not on file    Attends meetings of clubs or organizations: Not on file    Relationship status: Not on file  Other Topics Concern  . Not on file  Social History Narrative  . Not on file   Additional Social History:                         Sleep: Fair  Appetite:  Fair  Current Medications: Current Facility-Administered Medications  Medication Dose Route Frequency Provider Last Rate Last Dose  . acetaminophen (TYLENOL) tablet 650 mg  650 mg Oral Q6H PRN Oneta RackLewis, Tanika N, NP      . alum & mag hydroxide-simeth (MAALOX/MYLANTA) 200-200-20 MG/5ML suspension 30 mL  30 mL Oral Q4H PRN Oneta RackLewis, Tanika N, NP      . benztropine (COGENTIN) tablet 1 mg  1 mg Oral BID Malvin JohnsFarah, Kimberleigh Mehan, MD   1 mg at 05/14/19 1701  . haloperidol lactate (HALDOL) injection 10 mg  10 mg Intramuscular Q6H PRN Malvin JohnsFarah, Welles Walthall, MD      . hydrOXYzine (ATARAX/VISTARIL) tablet 25 mg  25 mg Oral TID PRN Oneta RackLewis, Tanika N, NP      . LORazepam (ATIVAN) tablet 2 mg  2 mg Oral Q6H Malvin JohnsFarah, Teandre Hamre, MD   2 mg at 05/15/19  16100033   Or  . LORazepam (ATIVAN) injection 2 mg  2 mg Intramuscular Q6H Malvin JohnsFarah, Stassi Fadely, MD      . magnesium hydroxide (MILK OF MAGNESIA) suspension 30 mL  30 mL Oral Daily PRN Oneta RackLewis, Tanika N, NP      . omega-3 acid ethyl esters (LOVAZA) capsule 1 g  1 g Oral BID Malvin JohnsFarah, Ramez Arrona, MD   1 g at 05/14/19 1701  . prenatal multivitamin tablet 1 tablet  1 tablet Oral Daily Malvin JohnsFarah, Kaydra Borgen, MD   1 tablet at 05/14/19 0846  . risperiDONE (RISPERDAL M-TABS) disintegrating tablet 6 mg  6 mg Oral QHS Malvin JohnsFarah, Fillmore Bynum, MD      . temazepam (RESTORIL) capsule 30 mg  30 mg Oral QHS Malvin JohnsFarah, Divinity Kyler, MD   30 mg at 05/15/19 0034  . traZODone (DESYREL) tablet 50 mg  50 mg Oral QHS PRN Oneta RackLewis, Tanika N, NP        Lab Results:  Results for orders placed or performed during the hospital encounter of 05/12/19 (from the past 48 hour(s))  TSH     Status: None   Collection Time: 05/13/19  6:16 PM  Result Value Ref Range   TSH 0.637  0.350 - 4.500 uIU/mL    Comment: Performed by a 3rd Generation assay with a functional sensitivity of <=0.01 uIU/mL. Performed at [facility], [address] [place], [place] [address]   Prolactin     Status: Abnormal   Collection Time: 05/13/19  6:16 PM  Result Value Ref Range   Prolactin 37.5 (H) 4.0 - 15.2 ng/mL    Comment: (NOTE) Performed At: [facility] [address] [place], [place] [id number] Jolene SchimkeNagendra Sanjai MD NW:[phone]   Hemoglobin A1c     Status: Abnormal   Collection Time: 05/13/19  6:16 PM  Result Value Ref Range   Hgb A1c MFr Bld 4.0 (L) 4.8 - 5.6 %    Comment: (NOTE) Pre diabetes:          5.7%-6.4% Diabetes:              >6.4% Glycemic control for   <7.0% adults with diabetes    Mean Plasma Glucose 68.1 mg/dL    Comment: Performed at [facility], [address] [place], [place] [address]  CBC     Status: Abnormal   Collection Time: 05/13/19  6:16 PM  Result Value Ref Range   WBC 3.0 (L) 4.0 - 10.5 K/uL   RBC 4.15 (L) 4.22 - 5.81 MIL/uL   Hemoglobin 13.2 13.0 - 17.0 g/dL   HCT 96.241.4 95.239.0 - 84.152.0 %   MCV 99.8 80.0 - 100.0 fL   MCH 31.8 26.0 - 34.0 pg   MCHC 31.9 30.0 - 36.0 g/dL   RDW 32.411.6 40.111.5 - 02.715.5 %   Platelets 275 150 - 400 K/uL   nRBC 0.0 0.0 - 0.2 %    Comment: Performed at [facility], [address] [place], [place] [address]  Comprehensive metabolic panel     Status: Abnormal   Collection Time: 05/13/19  6:16 PM  Result Value Ref Range   Sodium 140 135 - 145 mmol/L   Potassium 3.7 3.5 - 5.1 mmol/L   Chloride 107 98 - 111 mmol/L   CO2 27 22 - 32 mmol/L   Glucose, Bld 100 (H) 70 - 99 mg/dL   BUN 14 6 - 20 mg/dL   Creatinine, Ser 4.401.07 0.61 - 1.24 mg/dL   Calcium 8.8 (L)  8.9 - 10.3 mg/dL   Total Protein 6.7 6.5 - 8.1 g/dL   Albumin 4.0 3.5 - 5.0 g/dL   AST 18 15 - 41 U/L   ALT 13 0 - 44 U/L   Alkaline Phosphatase 48 38 - 126 U/L   Total Bilirubin 0.6 0.3 - 1.2 mg/dL   GFR calc non  Af Amer >60 >60 mL/min   GFR calc Af Amer >60 >60 mL/min   Anion gap 6 5 - 15    Comment: Performed at [facility], [address] [address] [place], [place] [address]  Lipid panel     Status: Abnormal   Collection Time: 05/13/19  6:16 PM  Result Value Ref Range   Cholesterol 180 0 - 200 mg/dL   Triglycerides 38 <150 mg/dL   HDL 45 >40 mg/dL   Total CHOL/HDL Ratio 4.0 RATIO   VLDL 8 0 - 40 mg/dL   LDL Cholesterol 127 (H) 0 - 99 mg/dL    Comment:        Total Cholesterol/HDL:CHD Risk Coronary Heart Disease Risk Table                     Men   Women  1/2 Average Risk   3.4   3.3  Average Risk       5.0   4.4  2 X Average Risk   9.6   7.1  3 X Average Risk  23.4   11.0        Use the calculated Patient Ratio above and the CHD Risk Table to determine the patient's CHD Risk.        ATP III CLASSIFICATION (LDL):  <100     mg/dL   Optimal  100-129  mg/dL   Near or Above                    Optimal  130-159  mg/dL   Borderline  160-189  mg/dL   High  >190     mg/dL   Very High Performed at [place] [address], [place], [place] [address]     Blood Alcohol level:  Lab Results  Component Value Date   ETH <10 91/47/8295    Metabolic Disorder Labs: Lab Results  Component Value Date   HGBA1C 4.0 (L) 05/13/2019   MPG 68.1 05/13/2019   Lab Results  Component Value Date   PROLACTIN 37.5 (H) 05/13/2019   Lab Results  Component Value Date   CHOL 180 05/13/2019   TRIG 38 05/13/2019   HDL 45 05/13/2019   CHOLHDL 4.0 05/13/2019   VLDL 8 05/13/2019   LDLCALC 127 (H) 05/13/2019    Physical Findings: AIMS: Facial and Oral Movements Muscles of Facial Expression: None, normal Lips and Perioral Area: None, normal Jaw: None, normal Tongue: None, normal,Extremity Movements Upper (arms, wrists, hands, fingers): None, normal Lower (legs, knees, ankles, toes): None, normal, Trunk Movements Neck, shoulders, hips: None, normal, Overall  Severity Severity of abnormal movements (highest score from questions above): None, normal Incapacitation due to abnormal movements: None, normal Patient's awareness of abnormal movements (rate only patient's report): No Awareness, Dental Status Current problems with teeth and/or dentures?: No Does patient usually wear dentures?: No  CIWA:  CIWA-Ar Total: 1 COWS:  COWS Total Score: 2  Musculoskeletal: Strength & Muscle Tone: within normal limits Gait & Station: normal Patient leans: N/A  Psychiatric Specialty Exam: Physical Exam  ROS  Blood pressure (!) 99/56, pulse  97, temperature 98.7 F (37.1 C), resp. rate 16, height 6' (1.829 m), weight 72.6 kg, SpO2 99 %.Body mass index is 21.7 kg/m.  General Appearance: Casual  Eye Contact:  Fair  Speech:  Clear and Coherent and Slow  Volume:  Decreased  Mood:  Dysphoric  Affect:  Congruent  Thought Process:  Linear and Descriptions of Associations: Circumstantial  Orientation:  Full (Time, Place, and Person)  Thought Content:  Illogical and Hallucinations: Visual  Suicidal Thoughts:  No  Homicidal Thoughts:  No  Memory:  Immediate;   Fair  Judgement:  Impaired  Insight:  Lacking  Psychomotor Activity:  Normal  Concentration:  Concentration: Fair  Recall:  [employer]  Fund of Knowledge:  Fair  Language:  Fair  Akathisia:  Negative  Handed:  Right  AIMS (if indicated):     Assets:  Leisure Time Physical Health  ADL's:  Intact  Cognition:  WNL  Sleep:  Number of Hours: 5.5     Treatment Plan Summary: Daily contact with patient to assess and evaluate symptoms and progress in treatment and Medication management change medication to sublingual preparation encourage compliance continue reality-based therapy monitor through weekend  [facility],Cherril Hett, MD 05/15/2019, 2:58 PM

## 2019-05-15 NOTE — Progress Notes (Signed)
DAR NOTE: Patient presents with flat affect and depressed mood.  Denies suicidal thoughts, pain, auditory and visual hallucinations.  Rates depression at 0, hopelessness at 0, and anxiety at 0.  Maintained on routine safety checks.  Patient refused prescribed medications after several encouragements.  Attended group and participated.  Patient visible in milieu with minimal interactions.  Educated and reminded about unit rules and protocols.  Patient is safe on and off the unit.

## 2019-05-15 NOTE — Progress Notes (Signed)
Damonta is increasingly paranoid and disorganized. He is disturbing his roommate, making him irritable. Pt is wandering halls; trying to go into another Pt's room. Pt is preoccupied about wanting to leave Monday. He states he wants to stay for the weekend. Pt states he wants to talk to the provider tomorrow. He is unsteady in gait. HFR initiated and maintained.

## 2019-05-15 NOTE — Progress Notes (Signed)
[place] NOVEL CORONAVIRUS (COVID-19) DAILY CHECK-OFF SYMPTOMS - answer yes or no to each - every day NO YES  Have you had a fever in the past 24 hours?  . Fever (Temp > 37.80C / 100F) X   Have you had any of these symptoms in the past 24 hours? . New Cough .  Sore Throat  .  Shortness of Breath .  Difficulty Breathing .  Unexplained Body Aches   X   Have you had any one of these symptoms in the past 24 hours not related to allergies?   . Runny Nose .  Nasal Congestion .  Sneezing   X   If you have had runny nose, nasal congestion, sneezing in the past 24 hours, has it worsened?  X   EXPOSURES - check yes or no X   Have you traveled outside the state in the past 14 days?  X   Have you been in contact with someone with a confirmed diagnosis of COVID-19 or PUI in the past 14 days without wearing appropriate PPE?  X   Have you been living in the same home as a person with confirmed diagnosis of COVID-19 or a PUI (household contact)?    X   Have you been diagnosed with COVID-19?    X              What to do next: Answered NO to all: Answered YES to anything:   Proceed with unit schedule Follow the [facility] Inpatient Flowsheet.   

## 2019-05-16 DIAGNOSIS — F209 Schizophrenia, unspecified: Secondary | ICD-10-CM

## 2019-05-16 NOTE — Progress Notes (Signed)
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 Patient will follow directions on first prompt.  Behavioral Response: Appropriate  Intervention: Worksheet  Activity:  Staff on [employer] were provided with a worksheet on [employer]. Staff was instructed to give it to the patients and have them work on it in place of [place]. Staff was also given 2 coloring sheets and 2 word search and were given the option to give them out.  Education:  Ability to follow Directions, Change of thought processes Discharge Planning.   Education Outcome: Acknowledges education/In group clarification offered  Clinical Observations/Feedback: . Due to COVID-19, guidelines group was not held. Group members were provided a learning activity worksheet to work on the topic and above-stated goals. LRT is available to answer any questions patient may have regarding the worksheet.  Tomi Likens, LRT/CTRS         Sharief Wainwright L Mikisha Roseland 05/16/2019 12:55 PM

## 2019-05-16 NOTE — Progress Notes (Signed)
Sir observe in dayroom; pacing and restless. Pt denies SI/HI/AVH at this time. Pt took scheduled meds. Pt appears to be responding to internal stimuli; laughing inappropriately at times. Support offered. Will continue with POC.

## 2019-05-16 NOTE — Progress Notes (Signed)
[place] NOVEL CORONAVIRUS (COVID-19) DAILY CHECK-OFF SYMPTOMS - answer yes or no to each - every day NO YES  Have you had a fever in the past 24 hours?  . Fever (Temp > 37.80C / 100F) X   Have you had any of these symptoms in the past 24 hours? . New Cough .  Sore Throat  .  Shortness of Breath .  Difficulty Breathing .  Unexplained Body Aches   X   Have you had any one of these symptoms in the past 24 hours not related to allergies?   . Runny Nose .  Nasal Congestion .  Sneezing   X   If you have had runny nose, nasal congestion, sneezing in the past 24 hours, has it worsened?  X   EXPOSURES - check yes or no X   Have you traveled outside the state in the past 14 days?  X   Have you been in contact with someone with a confirmed diagnosis of COVID-19 or PUI in the past 14 days without wearing appropriate PPE?  X   Have you been living in the same home as a person with confirmed diagnosis of COVID-19 or a PUI (household contact)?    X   Have you been diagnosed with COVID-19?    X              What to do next: Answered NO to all: Answered YES to anything:   Proceed with unit schedule Follow the [facility] Inpatient Flowsheet.   

## 2019-05-16 NOTE — Progress Notes (Signed)
Spirituality group facilitated by Simone Curia, MDIv, [place].  Group Description:  Group focused on topic of hope.  Patients participated in facilitated discussion around topic, connecting with one another around experiences and definitions for hope.  Group members engaged with visual explorer photos, reflecting on what hope looks like for them today.  Group engaged in discussion around how their definitions of hope are present today in hospital.   Modalities: Psycho-social ed, Adlerian, Narrative, MI Patient Progress: Pt Did not attend

## 2019-05-16 NOTE — Progress Notes (Addendum)
[facility]  05/16/2019 11:33 AM [name] MinorsLancine [name]  MRN:  [id number]   Subjective:  [name] [name] reported " What' the longest you can stay here?"  Evaluation: Patient observed standing at the nurses station.  He presents flat guarded but pleasant.  Mild paranoia noted.  Stated he and his sister has had a longstanding history of" going back and forth with each other."  He denied auditory or visual hallucinations.  As he reports the hallucinations are real.  He denies depression or depressive symptoms.  He reports taking his medications as prescribed.  Reports a good appetite.  States he is resting well throughout the night.  May discontinue unit restriction, as staff report orders was placed due to confusion on admission.  Support encouragement reassurance was provided.  Principal Problem: Schizophrenia, prominent negative symptoms, lack of insight, volatility at home Diagnosis: Active Problems:   Psychosis (HCC)   Schizophrenia (HCC)  Total Time spent with patient: 20 minutes  Past Medical History: History reviewed. No pertinent past medical history. History reviewed. No pertinent surgical history. Family History: History reviewed. No pertinent family history.  Social History:  Social History   Substance and Sexual Activity  Alcohol Use No     Social History   Substance and Sexual Activity  Drug Use No    Social History   Socioeconomic History  . Marital status: Single    Spouse name: Not on file  . Number of children: Not on file  . Years of education: Not on file  . Highest education level: Not on file  Occupational History  . Not on file  Social Needs  . Financial resource strain: Not on file  . Food insecurity    Worry: Not on file    Inability: Not on file  . Transportation needs    Medical: Not on file    Non-medical: Not on file  Tobacco Use  . Smoking status: Never Smoker  . Smokeless tobacco: Never Used  Substance and Sexual Activity  . Alcohol use: No  .  Drug use: No  . Sexual activity: Never  Lifestyle  . Physical activity    Days per week: Not on file    Minutes per session: Not on file  . Stress: Not on file  Relationships  . Social Musicianconnections    Talks on phone: Not on file    Gets together: Not on file    Attends religious service: Not on file    Active member of club or organization: Not on file    Attends meetings of clubs or organizations: Not on file    Relationship status: Not on file  Other Topics Concern  . Not on file  Social History Narrative  . Not on file   Additional Social History:                         Sleep: Fair  Appetite:  Fair  Current Medications: Current Facility-Administered Medications  Medication Dose Route Frequency Provider Last Rate Last Dose  . acetaminophen (TYLENOL) tablet 650 mg  650 mg Oral Q6H PRN Oneta RackLewis, Anabia Weatherwax N, NP      . alum & mag hydroxide-simeth (MAALOX/MYLANTA) 200-200-20 MG/5ML suspension 30 mL  30 mL Oral Q4H PRN Oneta RackLewis, Talli Kimmer N, NP      . benztropine (COGENTIN) tablet 1 mg  1 mg Oral BID Malvin JohnsFarah, Brian, MD   1 mg at 05/14/19 1701  . haloperidol lactate (HALDOL) injection 10 mg  10 mg Intramuscular Q6H PRN Johnn Hai, MD      . hydrOXYzine (ATARAX/VISTARIL) tablet 25 mg  25 mg Oral TID PRN Derrill Center, NP      . LORazepam (ATIVAN) tablet 2 mg  2 mg Oral Q6H Johnn Hai, MD   2 mg at 05/16/19 1103   Or  . LORazepam (ATIVAN) injection 2 mg  2 mg Intramuscular Q6H Johnn Hai, MD      . magnesium hydroxide (MILK OF MAGNESIA) suspension 30 mL  30 mL Oral Daily PRN Derrill Center, NP      . omega-3 acid ethyl esters (LOVAZA) capsule 1 g  1 g Oral BID Johnn Hai, MD   1 g at 05/14/19 1701  . prenatal multivitamin tablet 1 tablet  1 tablet Oral Daily Johnn Hai, MD   1 tablet at 05/14/19 0846  . risperiDONE (RISPERDAL M-TABS) disintegrating tablet 6 mg  6 mg Oral QHS Johnn Hai, MD   6 mg at 05/15/19 2112  . temazepam (RESTORIL) capsule 30 mg  30 mg Oral QHS  Johnn Hai, MD   30 mg at 05/15/19 2111  . traZODone (DESYREL) tablet 50 mg  50 mg Oral QHS PRN Derrill Center, NP        Lab Results:  No results found for this or any previous visit (from the past 67 hour(s)).  Blood Alcohol level:  Lab Results  Component Value Date   ETH <10 03/47/4259    Metabolic Disorder Labs: Lab Results  Component Value Date   HGBA1C 4.0 (L) 05/13/2019   MPG 68.1 05/13/2019   Lab Results  Component Value Date   PROLACTIN 37.5 (H) 05/13/2019   Lab Results  Component Value Date   CHOL 180 05/13/2019   TRIG 38 05/13/2019   HDL 45 05/13/2019   CHOLHDL 4.0 05/13/2019   VLDL 8 05/13/2019   LDLCALC 127 (H) 05/13/2019    Physical Findings: AIMS: Facial and Oral Movements Muscles of Facial Expression: None, normal Lips and Perioral Area: None, normal Jaw: None, normal Tongue: None, normal,Extremity Movements Upper (arms, wrists, hands, fingers): None, normal Lower (legs, knees, ankles, toes): None, normal, Trunk Movements Neck, shoulders, hips: None, normal, Overall Severity Severity of abnormal movements (highest score from questions above): None, normal Incapacitation due to abnormal movements: None, normal Patient's awareness of abnormal movements (rate only patient's report): No Awareness, Dental Status Current problems with teeth and/or dentures?: No Does patient usually wear dentures?: No  CIWA:  CIWA-Ar Total: 1 COWS:  COWS Total Score: 2  Musculoskeletal: Strength & Muscle Tone: within normal limits Gait & Station: normal Patient leans: N/A  Psychiatric Specialty Exam: Physical Exam  Vitals reviewed. Psychiatric: He has a normal mood and affect. His behavior is normal.    Review of Systems  Psychiatric/Behavioral: Positive for hallucinations. The patient is nervous/anxious.   All other systems reviewed and are negative.   Blood pressure (!) 99/56, pulse 97, temperature 98.7 F (37.1 C), resp. rate 16, height 6' (1.829 m),  weight 72.6 kg, SpO2 99 %.Body mass index is 21.7 kg/m.  General Appearance: Casual  Eye Contact:  Fair  Speech:  Clear and Coherent and Slow  Volume:  Decreased  Mood:  Dysphoric  Affect:  Congruent  Thought Process:  Linear and Descriptions of Associations: Circumstantial  Orientation:  Full (Time, Place, and Person)  Thought Content:  Illogical and Hallucinations: Visual  Suicidal Thoughts:  No  Homicidal Thoughts:  No  Memory:  Immediate;  Fair  Judgement:  Impaired  Insight:  Lacking  Psychomotor Activity:  Normal  Concentration:  Concentration: Fair  Recall:  [employer]Fair  Fund of Knowledge:  Fair  Language:  Fair  Akathisia:  Negative  Handed:  Right  AIMS (if indicated):     Assets:  Desire for Improvement Leisure Time Physical Health Resilience  ADL's:  Intact  Cognition:  WNL  Sleep:  Number of Hours: 5.5     Treatment Plan Summary: Daily contact with patient to assess and evaluate symptoms and progress in treatment and Medication management   Continue with current treatment plan on 05/16/2019 as listed below except were noted  Schizophrenia:  Continue Risperdal 6mg  p.o. nightly Continue Restoril 30 mg p.o. nightly Continue trazodone 50 mg p.o. nightly as needed Continue Cogentin 1 mg p.o. twice daily Continue omega-3 1 g twice daily Continue multivitamin 1 tablet p.o. daily  Patient encouraged to participate within the therapeutic milieu CSW to continue working on discharge disposition     Oneta Rackanika N Maleya Leever, NP 05/16/2019, 11:33 AM

## 2019-05-16 NOTE — Progress Notes (Signed)
Dar Note: Patient presents with anxious affect and mood.  Denies suicidal thoughts, auditory and visual hallucinations.  Appears paranoid, guarded and still suspicious.  Medication given after several encouragements.  Routine safety checks maintained every 15 minutes.  Continues to need a lot of redirection on the unit. States goal for today is discharge.  Patient is safe on the unit.

## 2019-05-17 NOTE — Progress Notes (Addendum)
[facility]  05/17/2019 10:39 AM [name] MinorsLancine Caterino  MRN:  [id number]   Subjective:  [name] [name] reported " I am okay these medications is just hard to swallow"  Evaluations: Patient continues to deny suicidal or homicidal ideations.  Denies auditory or visual hallucinations.  Patient observed taking medications as directed.  Denies any medication side effects.  Reports a good appetite.  States he is resting well throughout the night.  Patient presents with slow deliberate responses.  Some thought blocking noted throughout assessment.  Chart review patient observed responding to internal stimuli.  Support , encouragement and  reassurance was provided.  Principal Problem: Schizophrenia, prominent negative symptoms, lack of insight, volatility at home Diagnosis: Active Problems:   Psychosis (HCC)   Schizophrenia (HCC)  Total Time spent with patient: 20 minutes  Past Medical History: History reviewed. No pertinent past medical history. History reviewed. No pertinent surgical history. Family History: History reviewed. No pertinent family history.  Social History:  Social History   Substance and Sexual Activity  Alcohol Use No     Social History   Substance and Sexual Activity  Drug Use No    Social History   Socioeconomic History  . Marital status: Single    Spouse name: Not on file  . Number of children: Not on file  . Years of education: Not on file  . Highest education level: Not on file  Occupational History  . Not on file  Social Needs  . Financial resource strain: Not on file  . Food insecurity    Worry: Not on file    Inability: Not on file  . Transportation needs    Medical: Not on file    Non-medical: Not on file  Tobacco Use  . Smoking status: Never Smoker  . Smokeless tobacco: Never Used  Substance and Sexual Activity  . Alcohol use: No  . Drug use: No  . Sexual activity: Never  Lifestyle  . Physical activity    Days per week: Not on file    Minutes per  session: Not on file  . Stress: Not on file  Relationships  . Social Musicianconnections    Talks on phone: Not on file    Gets together: Not on file    Attends religious service: Not on file    Active member of club or organization: Not on file    Attends meetings of clubs or organizations: Not on file    Relationship status: Not on file  Other Topics Concern  . Not on file  Social History Narrative  . Not on file   Additional Social History:                         Sleep: Fair  Appetite:  Fair  Current Medications: Current Facility-Administered Medications  Medication Dose Route Frequency Provider Last Rate Last Dose  . acetaminophen (TYLENOL) tablet 650 mg  650 mg Oral Q6H PRN Oneta RackLewis, Tanika N, NP      . alum & mag hydroxide-simeth (MAALOX/MYLANTA) 200-200-20 MG/5ML suspension 30 mL  30 mL Oral Q4H PRN Oneta RackLewis, Tanika N, NP      . benztropine (COGENTIN) tablet 1 mg  1 mg Oral BID Malvin JohnsFarah, Brian, MD   1 mg at 05/17/19 0813  . haloperidol lactate (HALDOL) injection 10 mg  10 mg Intramuscular Q6H PRN Malvin JohnsFarah, Brian, MD      . hydrOXYzine (ATARAX/VISTARIL) tablet 25 mg  25 mg Oral TID PRN Hillery JacksLewis, Tanika  N, NP      . LORazepam (ATIVAN) tablet 2 mg  2 mg Oral Q6H Johnn Hai, MD   2 mg at 05/17/19 0813   Or  . LORazepam (ATIVAN) injection 2 mg  2 mg Intramuscular Q6H Johnn Hai, MD      . magnesium hydroxide (MILK OF MAGNESIA) suspension 30 mL  30 mL Oral Daily PRN Derrill Center, NP      . omega-3 acid ethyl esters (LOVAZA) capsule 1 g  1 g Oral BID Johnn Hai, MD   1 g at 05/17/19 0813  . prenatal multivitamin tablet 1 tablet  1 tablet Oral Daily Johnn Hai, MD   1 tablet at 05/17/19 0813  . risperiDONE (RISPERDAL M-TABS) disintegrating tablet 6 mg  6 mg Oral QHS Johnn Hai, MD   6 mg at 05/16/19 2106  . temazepam (RESTORIL) capsule 30 mg  30 mg Oral QHS Johnn Hai, MD   30 mg at 05/16/19 2106  . traZODone (DESYREL) tablet 50 mg  50 mg Oral QHS PRN Derrill Center, NP   50  mg at 05/16/19 2106    Lab Results:  No results found for this or any previous visit (from the past 48 hour(s)).  Blood Alcohol level:  Lab Results  Component Value Date   ETH <10 62/37/6283    Metabolic Disorder Labs: Lab Results  Component Value Date   HGBA1C 4.0 (L) 05/13/2019   MPG 68.1 05/13/2019   Lab Results  Component Value Date   PROLACTIN 37.5 (H) 05/13/2019   Lab Results  Component Value Date   CHOL 180 05/13/2019   TRIG 38 05/13/2019   HDL 45 05/13/2019   CHOLHDL 4.0 05/13/2019   VLDL 8 05/13/2019   LDLCALC 127 (H) 05/13/2019    Physical Findings: AIMS: Facial and Oral Movements Muscles of Facial Expression: None, normal Lips and Perioral Area: None, normal Jaw: None, normal Tongue: None, normal,Extremity Movements Upper (arms, wrists, hands, fingers): None, normal Lower (legs, knees, ankles, toes): None, normal, Trunk Movements Neck, shoulders, hips: None, normal, Overall Severity Severity of abnormal movements (highest score from questions above): None, normal Incapacitation due to abnormal movements: None, normal Patient's awareness of abnormal movements (rate only patient's report): No Awareness, Dental Status Current problems with teeth and/or dentures?: No Does patient usually wear dentures?: No  CIWA:  CIWA-Ar Total: 1 COWS:  COWS Total Score: 2  Musculoskeletal: Strength & Muscle Tone: within normal limits Gait & Station: normal Patient leans: N/A  Psychiatric Specialty Exam: Physical Exam  Vitals reviewed. Psychiatric: He has a normal mood and affect. His behavior is normal.    Review of Systems  Psychiatric/Behavioral: Positive for hallucinations. The patient is nervous/anxious.   All other systems reviewed and are negative.   Blood pressure (!) 99/56, pulse 97, temperature 98.7 F (37.1 C), resp. rate 16, height 6' (1.829 m), weight 72.6 kg, SpO2 99 %.Body mass index is 21.7 kg/m.  General Appearance: Casual  Eye Contact:  Fair   Speech:  Clear and Coherent and Slow  Volume:  Decreased  Mood:  Dysphoric  Affect:  Congruent  Thought Process:  Linear and Descriptions of Associations: Circumstantial  Orientation:  Full (Time, Place, and Person)  Thought Content:  Illogical and Hallucinations: Visual  Suicidal Thoughts:  No  Homicidal Thoughts:  No  Memory:  Immediate;   Fair  Judgement:  Impaired  Insight:  Lacking  Psychomotor Activity:  Normal  Concentration:  Concentration: Fair  Recall:  Fair  Fund of Knowledge:  Fair  Language:  Fair  Akathisia:  Negative  Handed:  Right  AIMS (if indicated):     Assets:  Desire for Improvement Leisure Time Physical Health Resilience  ADL's:  Intact  Cognition:  WNL  Sleep:  Number of Hours: 4     Treatment Plan Summary: Daily contact with patient to assess and evaluate symptoms and progress in treatment and Medication management   Continue with current treatment plan on 05/17/2019 as listed below except were noted  Schizophrenia:  Continue Risperdal 6mg  p.o. nightly Continue Restoril 30 mg p.o. nightly Continue trazodone 50 mg p.o. nightly as needed Continue Cogentin 1 mg p.o. twice daily Continue omega-3 1 g twice daily Continue multivitamin 1 tablet p.o. daily  Patient encouraged to participate within the therapeutic milieu CSW to continue working on discharge disposition     Oneta Rackanika N Lewis, NP 05/17/2019, 10:39 AM  Attest to NP progress note

## 2019-05-17 NOTE — [facility] Group Notes (Signed)
[facility] Group Notes: (Clinical Social Work)   05/17/2019      Type of Therapy:  Group Therapy   Participation Level:  Did Not Attend - was invited both individually by [facility] and by overhead announcement, chose not to attend.   Selmer Dominion, LCSW 05/17/2019, 12:32 PM

## 2019-05-17 NOTE — Progress Notes (Signed)
D: Pt denies SI/HI/AVH. Pt is pleasant and cooperative. Pt stated he was better. Pt continues to be paranoid/ guarded. Pt stated he likes the surroundings here.  A: Pt was offered support and encouragement. Pt was given some scheduled medications. Pt was encourage to attend groups. Q 15 minute checks were done for safety.  R:Pt attends groups and interacts well with peers and staff. Pt is taking some medication. Pt has no complaints.Pt receptive to treatment and safety maintained on unit.  Problem: Education: Goal: Emotional status will improve Outcome: Progressing   Problem: Education: Goal: Mental status will improve Outcome: Progressing

## 2019-05-17 NOTE — Plan of Care (Signed)
Progress note  D: pt found in bed; compliant with medication administration. Pt denies any physical problems or pain. Pt does appear to be thought blocking. Pt takes extended time taking his medications, and may be better suited for liquid medications if available. Pt must take each pill one at a time and chews them. Education provided. No evidence of learning. A: pt provided support and encouragement. Pt given medication per protocol and standing orders. Q46m safety checks implemented and continued.  R: pt safe on the unit. Will continue to monitor.   Pt progressing in the following metric  Problem: Education: Goal: Verbalization of understanding the information provided will improve Outcome: Progressing

## 2019-05-17 NOTE — Progress Notes (Signed)
pt tried to refuse his medications this evening. Pt was encouraged to take his Risperdal and Cogentin . Pt appeared to take them after much encouragement.
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
## 2019-05-18 NOTE — BHH Suicide Risk Assessment (Signed)
[place] INPATIENT:  Family/Significant Other Suicide Prevention Education  Suicide Prevention Education:  Education Completed;[name] [name], the father of the patient , has been identified by the patient as the family member/significant other with whom the patient will be residing, and identified as the person(s) who will aid the patient in the event of a mental health crisis (suicidal ideations/suicide attempt).  With written consent from the patient, the family member/significant other has been provided the following suicide prevention education, prior to the and/or following the discharge of the patient. [name] [name] expressed concern for his personal safety and is unsure he is able to allow his son to return to his home. He stated that he noticed a change in his son about one year ago but said the problem mostly started when was at [place]. The father reporting that his son has been isolating in room and having minimal interactions with anyone. That his hygiene has suffered, he barely eats and is not sleeping. In addition his behavior toward him has become increasingly aggressive the past 4 weeks. This the patient's first hospitalization and he is not involve with any mental health service providers.   The suicide prevention education provided includes the following:  Suicide risk factors  Suicide prevention and interventions  National Suicide Hotline telephone number  [facility] assessment telephone number  [facility] Emergency Assistance [place] and/or Residential Mobile Crisis Unit telephone number  Request made of family/significant other to:  Remove weapons (e.g., guns, rifles, knives), all items previously/currently identified as safety concern.    Remove drugs/medications (over-the-counter, prescriptions, illicit drugs), all items previously/currently identified as a safety concern.  The family member/significant other verbalizes understanding of the  suicide prevention education information provided.  The family member/significant other agrees to remove the items of safety concern listed above.  [name] [name] 05/18/2019, 3:37 PM

## 2019-05-18 NOTE — Progress Notes (Signed)
D: Pt denies SI/HI/AVH. Pt is pleasant and cooperative. Pt paranoid and forwards little this evening. Pt stated"I'm good" but was very watchful of peers.  A: Pt was offered support and encouragement. Pt was given scheduled medications. Pt was encourage to attend groups. Q 15 minute checks were done for safety.  R:Pt is taking medication. Pt has no complaints.Pt receptive to treatment and safety maintained on unit.  Problem: Education: Goal: Emotional status will improve Outcome: Progressing   Problem: Education: Goal: Verbalization of understanding the information provided will improve Outcome: Progressing
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
## 2019-05-18 NOTE — Plan of Care (Signed)
Progress note  D: pt found in bed; pt requested to push his medications til lunch. Pt was compliant at lunch but is still hesitant and stalls. Pt may still be thought blocking and asks the same questions regarding care repeatedly. Pt shows little evidence of learning with interactions. Pt denies any physical complaints or pain. Pt denies si/hi/ah/vh and verbally agrees to approach staff if these become apparent or before harming himself/others while at [place]. A: pt provided support and encouragement. Pt given medication per protocol and standing orders. Q22m safety checks implemented and continued.  R; pt safe on the unit. Will continue to monitor.   Pt progressing in the following metrics  Problem: Coping: Goal: Ability to identify and develop effective coping behavior will improve Outcome: Progressing   Problem: Self-Concept: Goal: Will verbalize positive feelings about self Outcome: Progressing

## 2019-05-18 NOTE — BHH Group Notes (Signed)
[facility] LCSW Group Therapy Note  Date/Time:  05/18/2019  11:00AM-12:00PM  Type of Therapy and Topic:  Group Therapy:  Music and Mood  Participation Level:  Active   Description of Group: In this process group, members listened to a variety of genres of music and identified that different types of music evoke different responses.  Patients were encouraged to identify music that was soothing for them and music that was energizing for them.  Patients discussed how this knowledge can help with wellness and recovery in various ways including managing depression and anxiety as well as encouraging healthy sleep habits.    Therapeutic Goals: 1. Patients will explore the impact of different varieties of music on mood 2. Patients will verbalize the thoughts they have when listening to different types of music 3. Patients will identify music that is soothing to them as well as music that is energizing to them 4. Patients will discuss how to use this knowledge to assist in maintaining wellness and recovery 5. Patients will explore the use of music as a coping skill  Summary of Patient Progress:  At the beginning of group, patient expressed that he felt good.  He answered questions about how various songs made him feel.  At the end of group he said he felt "alert" and "prepared."  Therapeutic Modalities: Solution Focused Brief Therapy Activity   Selmer Dominion, LCSW

## 2019-05-18 NOTE — BHH Counselor (Signed)
On 6/13 at approximately 2:30 pm patient was asked to complete social work assessment and asked CSW to return later. CSW will try again today.

## 2019-05-19 NOTE — Progress Notes (Signed)
D: Pt denies SI/HI/AVH. Pt continues to be paranoid on the unit . Pt has minimal interaction and forwards little at this time.  A: Pt was offered support and encouragement. Pt was given scheduled medications. Pt was encourage to attend groups. Q 15 minute checks were done for safety.  R: safety maintained on unit.  Problem: Education: Goal: Emotional status will improve Outcome: Progressing   Problem: Education: Goal: Mental status will improve Outcome: Progressing
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
## 2019-05-19 NOTE — Progress Notes (Signed)
Recreation Therapy Notes  Date: 6.15.20 Time: 1000 Location:  500 [address] Dayroom   Group Topic: Communication, Team Building, Problem Solving  Goal Area(s) Addresses:  Patient will effectively work with peer towards shared goal.  Patient will identify skill used to make activity successful.  Patient will identify how skills used during activity can be used to reach post d/c goals.   Intervention: STEM Activity   Activity:  Straw Bridge.  Patients were put into groups of 2-3.  Patients were given 20 straws and 2 feet of masking tape.  Patients were to work together to a free standing bridge that could hold a small 500 piece puzzle box.    Education: Education officer, community, Dentist.   Education Outcome: Acknowledges education/In group clarification offered/Needs additional education.   Clinical Observations/Feedback: Pt did not attend group.    Victorino Sparrow, LRT/CTRS         Victorino Sparrow A 05/19/2019 11:41 AM

## 2019-05-19 NOTE — Tx Team (Signed)
Interdisciplinary Treatment and Diagnostic Plan Update  05/19/2019 Time of Session: 09:17am [name] [name] MRN: [id number]  Principal Diagnosis: <principal problem not specified>  Secondary Diagnoses: Active Problems:   Psychosis ([facility])   Schizophrenia ([facility])   Current Medications:  Current Facility-Administered Medications  Medication Dose Route Frequency Provider Last Rate Last Dose  . acetaminophen (TYLENOL) tablet 650 mg  650 mg Oral Q6H PRN Oneta RackLewis, Tanika N, NP      . alum & mag hydroxide-simeth (MAALOX/MYLANTA) 200-200-20 MG/5ML suspension 30 mL  30 mL Oral Q4H PRN Oneta RackLewis, Tanika N, NP      . benztropine (COGENTIN) tablet 1 mg  1 mg Oral BID Malvin JohnsFarah, Brian, MD   1 mg at 05/18/19 1158  . haloperidol lactate (HALDOL) injection 10 mg  10 mg Intramuscular Q6H PRN Malvin JohnsFarah, Brian, MD      . hydrOXYzine (ATARAX/VISTARIL) tablet 25 mg  25 mg Oral TID PRN Oneta RackLewis, Tanika N, NP   25 mg at 05/19/19 0106  . LORazepam (ATIVAN) tablet 2 mg  2 mg Oral Q6H Malvin JohnsFarah, Brian, MD   2 mg at 05/19/19 0106   Or  . LORazepam (ATIVAN) injection 2 mg  2 mg Intramuscular Q6H Malvin JohnsFarah, Brian, MD      . magnesium hydroxide (MILK OF MAGNESIA) suspension 30 mL  30 mL Oral Daily PRN Oneta RackLewis, Tanika N, NP      . omega-3 acid ethyl esters (LOVAZA) capsule 1 g  1 g Oral BID Malvin JohnsFarah, Brian, MD   1 g at 05/18/19 1158  . prenatal multivitamin tablet 1 tablet  1 tablet Oral Daily Malvin JohnsFarah, Brian, MD   1 tablet at 05/18/19 1158  . risperiDONE (RISPERDAL M-TABS) disintegrating tablet 6 mg  6 mg Oral QHS Malvin JohnsFarah, Brian, MD   6 mg at 05/18/19 2202  . temazepam (RESTORIL) capsule 30 mg  30 mg Oral QHS Malvin JohnsFarah, Brian, MD   30 mg at 05/18/19 2202  . traZODone (DESYREL) tablet 50 mg  50 mg Oral QHS PRN Oneta RackLewis, Tanika N, NP   50 mg at 05/19/19 0105   PTA Medications: No medications prior to admission.    Patient Stressors: Legal issue Marital or family conflict Medication change or noncompliance  Patient Strengths: Average or above average  intelligence Communication skills Supportive family/friends  Treatment Modalities: Medication Management, Group therapy, Case management,  1 to 1 session with clinician, Psychoeducation, Recreational therapy.   Physician Treatment Plan for Primary Diagnosis: <principal problem not specified> Long Term Goal(s): Improvement in symptoms so as ready for discharge Improvement in symptoms so as ready for discharge   Short Term Goals: Ability to disclose and discuss suicidal ideas Ability to demonstrate self-control will improve Ability to identify and develop effective coping behaviors will improve Ability to demonstrate self-control will improve Ability to maintain clinical measurements within normal limits will improve  Medication Management: Evaluate patient's response, side effects, and tolerance of medication regimen.  Therapeutic Interventions: 1 to 1 sessions, Unit Group sessions and Medication administration.  Evaluation of Outcomes: Progressing  Physician Treatment Plan for Secondary Diagnosis: Active Problems:   Psychosis ([facility])   Schizophrenia ([facility])  Long Term Goal(s): Improvement in symptoms so as ready for discharge Improvement in symptoms so as ready for discharge   Short Term Goals: Ability to disclose and discuss suicidal ideas Ability to demonstrate self-control will improve Ability to identify and develop effective coping behaviors will improve Ability to demonstrate self-control will improve Ability to maintain clinical measurements within normal limits will improve  Medication Management: Evaluate patient's response, side effects, and tolerance of medication regimen.  Therapeutic Interventions: 1 to 1 sessions, Unit Group sessions and Medication administration.  Evaluation of Outcomes: Progressing   RN Treatment Plan for Primary Diagnosis: <principal problem not specified> Long Term Goal(s): Knowledge of disease and therapeutic regimen to maintain health  will improve  Short Term Goals: Ability to participate in decision making will improve, Ability to verbalize feelings will improve, Ability to disclose and discuss suicidal ideas, Ability to identify and develop effective coping behaviors will improve and Compliance with prescribed medications will improve  Medication Management: RN will administer medications as ordered by provider, will assess and evaluate patient's response and provide education to patient for prescribed medication. RN will report any adverse and/or side effects to prescribing provider.  Therapeutic Interventions: 1 on 1 counseling sessions, Psychoeducation, Medication administration, Evaluate responses to treatment, Monitor vital signs and CBGs as ordered, Perform/monitor CIWA, COWS, AIMS and Fall Risk screenings as ordered, Perform wound care treatments as ordered.  Evaluation of Outcomes: Progressing   LCSW Treatment Plan for Primary Diagnosis: <principal problem not specified> Long Term Goal(s): Safe transition to appropriate next level of care at discharge, Engage patient in therapeutic group addressing interpersonal concerns.  Short Term Goals: Engage patient in aftercare planning with referrals and resources and Increase skills for wellness and recovery  Therapeutic Interventions: Assess for all discharge needs, 1 to 1 time with Social worker, Explore available resources and support systems, Assess for adequacy in community support network, Educate family and significant other(s) on suicide prevention, Complete Psychosocial Assessment, Interpersonal group therapy.  Evaluation of Outcomes: Progressing   Progress in Treatment: Attending groups: Yes. Participating in groups: Yes. Taking medication as prescribed: No. Toleration medication: No. Family/Significant other contact made: Yes, individual(s) contacted:  pt's father Patient understands diagnosis: No. Discussing patient identified problems/goals with staff:  Yes. Medical problems stabilized or resolved: Yes. Denies suicidal/homicidal ideation: Yes. Issues/concerns per patient self-inventory: No. Other:   New problem(s) identified: No, Describe:  None  New Short Term/Long Term Goal(s): Medication stabilization, elimination of SI thoughts, and development of a comprehensive mental wellness plan.   Patient Goals:  "To not see or hear things"  Discharge Plan or Barriers: CSW will continue to follow up for appropriate referrals and possible discharge planning  Reason for Continuation of Hospitalization: Hallucinations Medication stabilization  Estimated Length of Stay: 2-3 days  Attendees: Patient: 05/19/2019   Physician: Dr. Johnn Hai, MD 05/19/2019   Nursing: Legrand Como, RN 05/19/2019   RN Care Manager: 05/19/2019  Social Worker: Ardelle Anton, LCSW 05/19/2019   Recreational Therapist:  05/19/2019   Other:  05/19/2019   Other:  05/19/2019  Other: 05/19/2019      Scribe for Treatment Team: Trecia Rogers, LCSW 05/19/2019 10:37 AM

## 2019-05-19 NOTE — Plan of Care (Signed)
Progress note  D: pt found in bed; non-compliant with medication administration. Pt is becoming more avoidant and reclusive to his room. Pt denies any physical pain or symptoms. Pt is still pleasant. Pt denies si/hi/ah/vh and verbally agrees to approach staff if these become apparent or before harming himself/others while at [facility]. A: pt provided support and encouragement. Pt given medication per protocol and standing orders. Q72m safety checks implemented and continued.  R: pt safe on the unit. Will continue to monitor.   Pt progressing in the following metrics  Problem: Education: Goal: Knowledge of  General Education information/materials will improve Outcome: Progressing Goal: Verbalization of understanding the information provided will improve Outcome: Progressing

## 2019-05-20 NOTE — Progress Notes (Signed)
Recreation Therapy Notes  Date: 6.16.20 Time:  1000 Location: 500 Hall Dayroom  Group Topic:  Goal Setting  Goal Area(s) Addresses:  Patient will be able to identify at least 3 life goals .  Patient will be able to identify benefit of investing in life goals.  Patient will be able to identify benefit of setting life goals.   Intervention: Worksheet  Activity: Goal Planning.  Patients were given [name] worksheet that asked patients to set [name] goal for the week, month, year and 5 years.  Patients were to then identify obstacles they would face, what they need to achieve goals and what they could start doing now to work towards goals.  Education:  Discharge Planning, Coping Skills  Education Outcome: Acknowledges Education/In Group Clarification Provided/Needs Additional Education  Clinical Observations:  Pt did not attend group.   [name] [name], LRT/CTRS         [name] [name] 05/20/2019 11:30 AM 
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
## 2019-05-21 MED ORDER — HALOPERIDOL DECANOATE 100 MG/ML IM SOLN
100.0000 mg | INTRAMUSCULAR | Status: DC
  Filled 2019-05-21: qty 1

## 2019-05-21 NOTE — Progress Notes (Signed)
[facility] MD Progress Note  05/21/2019 10:36 AM [name] [name]  MRN:  [id number] Subjective:    Patient seen he is actually showing about as much improvement as he can given the predominance of negative symptoms.  He does not have thoughts of harming self or others agrees to long-acting injectable again more redirectable excepting that he cannot go home.  I spoke with his father 1 more time to discuss this I explained to the father that group homes take a long time to find and the patient himself is alert and oriented and denying all positive symptoms but the father insist that he is afraid with the patient and will not have him back in the house so it appears a we will give him the long-acting injectable and even with that the father would not take him and so therefore he will probably be discharged tomorrow to a shelter  Principal Problem: Chronic untreated psychosis Diagnosis: Active Problems:   Psychosis ([place])   Schizophrenia ([place])  Total Time spent with patient: 20 minutes  Past Medical History: History reviewed. No pertinent past medical history. History reviewed. No pertinent surgical history. Family History: History reviewed. No pertinent family history. Family Psychiatric  History: neg Social History:  Social History   Substance and Sexual Activity  Alcohol Use No     Social History   Substance and Sexual Activity  Drug Use No    Social History   Socioeconomic History  . Marital status: Single    Spouse name: Not on file  . Number of children: Not on file  . Years of education: Not on file  . Highest education level: Not on file  Occupational History  . Not on file  Social Needs  . Financial resource strain: Not on file  . Food insecurity    Worry: Not on file    Inability: Not on file  . Transportation needs    Medical: Not on file    Non-medical: Not on file  Tobacco Use  . Smoking status: Never Smoker  . Smokeless tobacco: Never Used  Substance and Sexual Activity   . Alcohol use: No  . Drug use: No  . Sexual activity: Never  Lifestyle  . Physical activity    Days per week: Not on file    Minutes per session: Not on file  . Stress: Not on file  Relationships  . Social Herbalist on phone: Not on file    Gets together: Not on file    Attends religious service: Not on file    Active member of club or organization: Not on file    Attends meetings of clubs or organizations: Not on file    Relationship status: Not on file  Other Topics Concern  . Not on file  Social History Narrative  . Not on file   Additional Social History:                         Sleep: Good  Appetite:  Good  Current Medications: Current Facility-Administered Medications  Medication Dose Route Frequency Provider Last Rate Last Dose  . acetaminophen (TYLENOL) tablet 650 mg  650 mg Oral Q6H PRN Derrill Center, NP      . alum & mag hydroxide-simeth (MAALOX/MYLANTA) 200-200-20 MG/5ML suspension 30 mL  30 mL Oral Q4H PRN Derrill Center, NP      . benztropine (COGENTIN) tablet 1 mg  1 mg Oral BID Johnn Hai,  MD   1 mg at 05/21/19 0926  . haloperidol decanoate (HALDOL DECANOATE) 100 MG/ML injection 100 mg  100 mg Intramuscular Q30 days Malvin JohnsFarah, Joselinne Lawal, MD      . haloperidol lactate (HALDOL) injection 10 mg  10 mg Intramuscular Q6H PRN Malvin JohnsFarah, Katherinne Mofield, MD      . hydrOXYzine (ATARAX/VISTARIL) tablet 25 mg  25 mg Oral TID PRN Oneta RackLewis, Tanika N, NP   25 mg at 05/20/19 2151  . LORazepam (ATIVAN) tablet 2 mg  2 mg Oral Q6H Malvin JohnsFarah, Kaziyah Parkison, MD   2 mg at 05/21/19 [id number]   Or  . LORazepam (ATIVAN) injection 2 mg  2 mg Intramuscular Q6H Malvin JohnsFarah, Farron Lafond, MD      . magnesium hydroxide (MILK OF MAGNESIA) suspension 30 mL  30 mL Oral Daily PRN Oneta RackLewis, Tanika N, NP      . omega-3 acid ethyl esters (LOVAZA) capsule 1 g  1 g Oral BID Malvin JohnsFarah, Kaybree Williams, MD   1 g at 05/21/19 0927  . prenatal multivitamin tablet 1 tablet  1 tablet Oral Daily Malvin JohnsFarah, Nyah Shepherd, MD   1 tablet at 05/20/19 1235  .  risperiDONE (RISPERDAL M-TABS) disintegrating tablet 6 mg  6 mg Oral QHS Malvin JohnsFarah, Tally Mattox, MD   6 mg at 05/20/19 2151  . temazepam (RESTORIL) capsule 30 mg  30 mg Oral QHS Malvin JohnsFarah, Shyanne Mcclary, MD   30 mg at 05/20/19 2151  . traZODone (DESYREL) tablet 50 mg  50 mg Oral QHS PRN Oneta RackLewis, Tanika N, NP   50 mg at 05/20/19 2151    Lab Results: No results found for this or any previous visit (from the past 48 hour(s)).  Blood Alcohol level:  Lab Results  Component Value Date   ETH <10 05/11/2019    Metabolic Disorder Labs: Lab Results  Component Value Date   HGBA1C 4.0 (L) 05/13/2019   MPG 68.1 05/13/2019   Lab Results  Component Value Date   PROLACTIN 37.5 (H) 05/13/2019   Lab Results  Component Value Date   CHOL 180 05/13/2019   TRIG 38 05/13/2019   HDL 45 05/13/2019   CHOLHDL 4.0 05/13/2019   VLDL 8 05/13/2019   LDLCALC 127 (H) 05/13/2019    Physical Findings: AIMS: Facial and Oral Movements Muscles of Facial Expression: None, normal Lips and Perioral Area: None, normal Jaw: None, normal Tongue: None, normal,Extremity Movements Upper (arms, wrists, hands, fingers): None, normal Lower (legs, knees, ankles, toes): None, normal, Trunk Movements Neck, shoulders, hips: None, normal, Overall Severity Severity of abnormal movements (highest score from questions above): None, normal Incapacitation due to abnormal movements: None, normal Patient's awareness of abnormal movements (rate only patient's report): No Awareness, Dental Status Current problems with teeth and/or dentures?: No Does patient usually wear dentures?: No  CIWA:  CIWA-Ar Total: 1 COWS:  COWS Total Score: 2  Musculoskeletal: Strength & Muscle Tone: within normal limits Gait & Station: normal Patient leans: N/A  Psychiatric Specialty Exam: Physical Exam  ROS  Blood pressure (!) 99/57, pulse 60, temperature 98.7 F (37.1 C), resp. rate 16, height 6' (1.829 m), weight 72.6 kg, SpO2 98 %.Body mass index is 21.7 kg/m.   General Appearance: Casual  Eye Contact:  Fair  Speech:  Normal Rate  Volume:  Decreased  Mood:  Dysphoric  Affect:  Congruent  Thought Process:  Irrelevant and Descriptions of Associations: Tangential  Orientation:  Full (Time, Place, and Person)  Thought Content:  Rumination  Suicidal Thoughts:  No  Homicidal Thoughts:  No  Memory:  Immediate;  Fair  Judgement:  Fair  Insight:  Shallow  Psychomotor Activity:  Normal  Concentration:  Concentration: Fair  Recall:  [employer]Fair  Fund of Knowledge:  Fair  Language:  Fair  Akathisia:  NA  Handed:  Right  AIMS (if indicated):     Assets:  Resilience Social Support  ADL's:  Intact  Cognition:  WNL  Sleep:  Number of Hours: 4.75     Treatment Plan Summary: Daily contact with patient to assess and evaluate symptoms and progress in treatment and Medication management as above, long-acting injectable today probable discharge tomorrow no change in meds  Jiles Goya, MD 05/21/2019, 10:36 AM

## 2019-05-21 NOTE — Progress Notes (Signed)
Patient ID: [name] [name], male   DOB: 09/23/1996, 23 y.o.   MRN: [id number]   CSW received a voicemail from the pt's father wanting the social worker to call him back. CSW    Dr. Jake Samples talked to pt's father about giving the pt an injectable and sending him home tomorrow. Pt's father states that the pt is still hearing voices. Pt's father states that he wants the pt to go somewhere else and not come home. Pt's father states that he is still scared of the pt causing harm to him or his sister. Pt's father states that even with the injectable that he still does not want him at home. CSW mentioned getting the pt a CST team through [facility]. Pt's father states that even in his home; he will not do what he is told. CSW asked the pt's father if CSW can reach out to [facility] for possible shelter placement.   CSW left a HIPPA compliant voicemail with [facility].    CSW contacted pt's father back to let him know that CSW left a voicemail but that shelters are not happening mostly due to [place]. Pt's father stated that he has to go somewhere and asked if he could stay or be transferred to another hospital. [facility] let him know the process of transfer or referral to another hospital. CSW let pt know that CSW will reach out to the doctor and discuss a plan and CSW will get back with the pt's father.

## 2019-05-21 NOTE — Progress Notes (Addendum)
Patient ID: [name] [name], male   DOB: 28-Oct-1996, 23 y.o.   MRN: [id number]  [place] NOVEL CORONAVIRUS (COVID-19) DAILY CHECK-OFF SYMPTOMS - answer yes or no to each - every day NO YES  Have you had a fever in the past 24 hours?  . Fever (Temp > 37.80C / 100F) X   Have you had any of these symptoms in the past 24 hours? . New Cough .  Sore Throat  .  Shortness of Breath .  Difficulty Breathing .  Unexplained Body Aches   X   Have you had any one of these symptoms in the past 24 hours not related to allergies?   . Runny Nose .  Nasal Congestion .  Sneezing   X   If you have had runny nose, nasal congestion, sneezing in the past 24 hours, has it worsened?  X   EXPOSURES - check yes or no X   Have you traveled outside the state in the past 14 days?  X   Have you been in contact with someone with a confirmed diagnosis of COVID-19 or PUI in the past 14 days without wearing appropriate PPE?  X   Have you been living in the same home as a person with confirmed diagnosis of COVID-19 or a PUI (household contact)?    X   Have you been diagnosed with COVID-19?    X              What to do next: Answered NO to all: Answered YES to anything:   Proceed with unit schedule Follow the [facility] Inpatient Flowsheet.

## 2019-05-21 NOTE — Progress Notes (Signed)
Recreation Therapy Notes  Date: 6.17.20 Time: 1000 Location: 500 Hall Dayroom  Group Topic: Triggers  Goal Area(s) Addresses:  Patient will identify their biggest triggers. Patient will identify how to avoid triggers. Patient will identify how to deal with triggers head on.   Intervention: Worksheet  Activity: Triggers.  Patients were to identify their three biggest triggers.  Patients were to identify three ways in which to deal with trigger head on and three was to avoid or reduce exposure to triggers.  Education: Triggers, Discharge Planning  Education Outcome: Acknowledges understanding/In group clarification offered/Needs additional education.   Clinical Observations/Feedback: Pt did not attend group.     Victorino Sparrow, LRT/CTRS         Victorino Sparrow A 05/21/2019 11:57 AM

## 2019-05-21 NOTE — Progress Notes (Signed)
Patient ID: [name] [name], male   DOB: 1996/09/29, 23 y.o.   MRN: [id number]  CSW talked to Dr. Jake Samples about the discharging of the pt tomorrow and lack of shelters in the area.  CSW contacted pt's father to let him know that the doctor will be discharging the pt tomorrow. Pt's father was upset and begged for the pt to be able to stay longer until he can possibly find housing for the pt because he does not want him to come home. Pt's father stated that he is going out tomorrow to try and look for an apartment or somewhere for him to go. Pt's father asked for the number for [employer]. CSW provided that phone number. Pt's father stated that he is going to call the doctor because he wants to beg him to let him stay.

## 2019-05-21 NOTE — Progress Notes (Signed)
D: Pt denies SI/HI/AVH. Pt is pleasant and cooperative. Pt continues to present bizarre, paranoid . Pt visible on unit some this evening, but keeps to himself A: Pt was offered support and encouragement. Pt was given scheduled medications. Pt was encourage to attend groups. Q 15 minute checks were done for safety.  R:Pt attends groups and interacts well with peers and staff. Pt is taking medication. Pt has no complaints.Pt receptive to treatment and safety maintained on unit.  Problem: Education: Goal: Emotional status will improve Outcome: Progressing   Problem: Coping: Goal: Ability to identify and develop effective coping behavior will improve Outcome: Progressing

## 2019-05-21 NOTE — Progress Notes (Signed)
Pt currently asleep in bed. Respiration are even and unlabored. Pt in no sign of distress. Will continue to monitor.   

## 2019-05-21 NOTE — Plan of Care (Signed)
D: Pt alert and oriented on the unit. Pt denies SI/HI, A/VH. Pt was isolative in his room for most of the day asleep and sitting in his room. Pt sat in the dayroom this afternoon and ate a snack and watched television before returning to his room.  Pt denied any pain and was cooperative.  A: Education, support and encouragement provided, q15 minute safety checks remain in effect. Medications administered per MD orders.  R: No reactions/side effects to medicine noted. Pt denies any concerns at this time, and verbally contracts for safety. Pt ambulating on the unit with no issues. Pt remains safe on and off the unit.   Problem: Coping: Goal: Ability to identify and develop effective coping behavior will improve Outcome: Progressing

## 2019-05-22 MED ORDER — HALOPERIDOL DECANOATE 100 MG/ML IM SOLN: 100.0000 mg | INTRAMUSCULAR | 11 refills | Status: AC

## 2019-05-22 MED ORDER — TRAZODONE HCL 150 MG PO TABS
300.0000 mg | ORAL_TABLET | Freq: Every day | ORAL | Status: DC
  Filled 2019-05-22: qty 2

## 2019-05-22 MED ORDER — FLUOXETINE HCL 20 MG PO CAPS: 20.0000 mg | ORAL_CAPSULE | Freq: Every day | ORAL | 2 refills | Status: AC

## 2019-05-22 MED ORDER — HALOPERIDOL 5 MG PO TABS
5.0000 mg | ORAL_TABLET | Freq: Two times a day (BID) | ORAL | Status: DC
  Administered 2019-05-22: 5 mg via ORAL
  Filled 2019-05-22 (×4): qty 1
  Filled 2019-05-22: qty 14
  Filled 2019-05-22: qty 1
  Filled 2019-05-22: qty 14

## 2019-05-22 MED ORDER — FLUOXETINE HCL 20 MG PO CAPS
20.0000 mg | ORAL_CAPSULE | Freq: Every day | ORAL | Status: DC
  Filled 2019-05-22: qty 1

## 2019-05-22 MED ORDER — TRAZODONE HCL 300 MG PO TABS: 300.0000 mg | ORAL_TABLET | Freq: Every day | ORAL | 2 refills | Status: AC

## 2019-05-22 MED ORDER — HALOPERIDOL 5 MG PO TABS: 5.0000 mg | ORAL_TABLET | Freq: Two times a day (BID) | ORAL | 2 refills | Status: AC

## 2019-05-22 MED ORDER — OMEGA-3-ACID ETHYL ESTERS 1 G PO CAPS: 1.0000 g | ORAL_CAPSULE | Freq: Two times a day (BID) | ORAL | 2 refills | Status: AC

## 2019-05-22 MED ORDER — BENZTROPINE MESYLATE 1 MG PO TABS: 1.0000 mg | ORAL_TABLET | Freq: Two times a day (BID) | ORAL | 3 refills | Status: AC

## 2019-05-22 NOTE — Progress Notes (Signed)
  [facility] Adult Case Management Discharge Plan :  Will you be returning to the same living situation after discharge:  Yes,  with father At discharge, do you have transportation home?: Yes,  father Do you have the ability to pay for your medications: Yes,  BCBS  Release of information consent forms completed and in the chart;  Patient's signature needed at discharge.  Patient to Follow up at: Follow-up Information    [facility] Follow up on 05/27/2019.   Why: Telephonic hospital follow up appointment is Tuesday, 6/23 at 9:30a.  The provider will contact you the day of the appointment.  Contact information: [address] [place] [address] [phone]           Next level of care provider has access to [place] and Suicide Prevention discussed: Yes,  with father  Have you used any form of tobacco in the last 30 days? (Cigarettes, Smokeless Tobacco, Cigars, and/or Pipes): No  Has patient been referred to the Quitline?: N/A patient is not a smoker  Patient has been referred for addiction treatment: N/A  Joanne Chars, LCSW 05/22/2019, 10:04 AM

## 2019-05-22 NOTE — Progress Notes (Signed)
Recreation Therapy Notes  INPATIENT RECREATION TR PLAN  Patient Details Name: [name] [name] MRN: [id number] DOB: 30-Jan-1996 Today's Date: 05/22/2019  Rec Therapy Plan Is patient appropriate for Therapeutic Recreation?: Yes Treatment times per week: 3-5 times per week Estimated Length of Stay: 5-7 days  TR Treatment/Interventions: Group participation (Comment)  Discharge Criteria Pt will be discharged from therapy if:: Discharged Treatment plan/goals/alternatives discussed and agreed upon by:: Patient/family  Discharge Summary Short term goals set: See patient care plan Short term goals met: Not met Reason goals not met: Pt did not attend groups Therapeutic equipment acquired: N/A Reason patient discharged from therapy: Discharge from hospital Pt/family agrees with progress & goals achieved: Yes Date patient discharged from therapy: 05/22/19     Victorino Sparrow, LRT/CTRS  Ria Comment, Oluwatosin Higginson A 05/22/2019, 11:25 AM

## 2019-05-22 NOTE — Plan of Care (Signed)
Pt did not attend recreation therapy group sessions.   [name] [name], LRT/CTRS 

## 2019-05-22 NOTE — [facility] Suicide Risk Assessment (Signed)
[facility] Discharge Suicide Risk Assessment   Principal Problem: schizophrenia Discharge Diagnoses: Active Problems:   Psychosis ([place])   Schizophrenia ([place])   Total Time spent with patient: 45 minutes  Musculoskeletal: Strength & Muscle Tone: within normal limits Gait & Station: normal Patient leans: N/A  Psychiatric Specialty Exam: ROS  Blood pressure (!) 99/57, pulse 60, temperature 98.7 F (37.1 C), resp. rate 16, height 6' (1.829 m), weight 72.6 kg, SpO2 98 %.Body mass index is 21.7 kg/m.  General Appearance: Casual  Eye Contact::  Good  Speech:  Clear and Coherent409  Volume:  Normal  Mood:  Euthymic  Affect:  Full Range  Thought Process:  Coherent and Descriptions of Associations: Circumstantial  Orientation:  Full (Time, Place, and Person)  Thought Content:  Logical and Tangential  Suicidal Thoughts:  No  Homicidal Thoughts:  No  Memory:  Immediate;   Fair  Judgement:  Fair  Insight:  Fair  Psychomotor Activity:  NA and Normal  Concentration:  Fair  Recall:  Good  Fund of Knowledge:Good  Language: Good  Akathisia:  Negative  Handed:  Right  AIMS (if indicated):     Assets:  Communication Skills Desire for Improvement  Sleep:  Number of Hours: 6.25  Cognition: WNL  ADL's:  Intact   Mental Status Per Nursing Assessment::   On Admission:  NA  Demographic Factors:  Male and Adolescent or young adult  Loss Factors: Decrease in vocational status  Historical Factors: Impulsivity  Risk Reduction Factors:   Sense of responsibility to family  Continued Clinical Symptoms:  Schizophrenia:   Less than 92 years old  Cognitive Features That Contribute To Risk:  Loss of executive function    Suicide Risk:  Minimal: No identifiable suicidal ideation.  Patients presenting with no risk factors but with morbid ruminations; may be classified as minimal risk based on the severity of the depressive symptoms  Follow-up Information    [facility] Follow up on 05/27/2019.    Why: Telephonic hospital follow up appointment is Tuesday, 6/23 at 9:30a.  The provider will contact you the day of the appointment.  Contact information: [address] [place] [address] [phone]           Plan Of Care/Follow-up recommendations:  Activity:  full  Javae Braaten, MD 05/22/2019, 10:07 AM
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
## 2019-05-22 NOTE — Plan of Care (Signed)
Discharge note  Patient verbalizes readiness for discharge. Follow up plan explained, AVS, Transition record and SRA given. Prescriptions and teaching provided Belongings returned and signed for. Suicide safety plan completed and signed. Patient verbalizes understanding. Patient denies SI/HI and assures this Probation officer he will seek assistance should that change. Patient discharged to lobby where father was waiting.  Problem: Education: Goal: Knowledge of [facility] Education information/materials will improve Outcome: Adequate for Discharge Goal: Emotional status will improve Outcome: Adequate for Discharge Goal: Mental status will improve Outcome: Adequate for Discharge Goal: Verbalization of understanding the information provided will improve Outcome: Adequate for Discharge   Problem: Coping: Goal: Ability to identify and develop effective coping behavior will improve Outcome: Adequate for Discharge   Problem: Self-Concept: Goal: Will verbalize positive feelings about self Outcome: Adequate for Discharge

## 2019-05-27 ENCOUNTER — Observation Stay (HOSPITAL_COMMUNITY)
Admission: AD | Admit: 2019-05-27 | Discharge: 2019-06-03 | Disposition: A | Discharge status: Home / Self Care | Payer: BC Managed Care – PPO | Attending: Psychiatry | Admitting: Psychiatry

## 2019-05-27 ENCOUNTER — Other Ambulatory Visit: Payer: Self-pay

## 2019-05-27 ENCOUNTER — Encounter (HOSPITAL_COMMUNITY): Payer: Self-pay | Admitting: Behavioral Health

## 2019-05-27 DIAGNOSIS — F209 Schizophrenia, unspecified: Secondary | ICD-10-CM | POA: Diagnosis not present

## 2019-05-27 DIAGNOSIS — Z79899 Other long term (current) drug therapy: Secondary | ICD-10-CM | POA: Diagnosis not present

## 2019-05-27 DIAGNOSIS — F200 Paranoid schizophrenia: Secondary | ICD-10-CM

## 2019-05-27 DIAGNOSIS — Z1159 Encounter for screening for other viral diseases: Secondary | ICD-10-CM | POA: Diagnosis not present

## 2019-05-27 DIAGNOSIS — G4700 Insomnia, unspecified: Secondary | ICD-10-CM | POA: Diagnosis not present

## 2019-05-27 DIAGNOSIS — Z9114 Patient's other noncompliance with medication regimen: Secondary | ICD-10-CM | POA: Insufficient documentation

## 2019-05-27 MED ORDER — HALOPERIDOL 5 MG PO TABS
5.0000 mg | ORAL_TABLET | Freq: Two times a day (BID) | ORAL | Status: DC
  Administered 2019-05-28: 5 mg via ORAL
  Filled 2019-05-27: qty 1

## 2019-05-27 MED ORDER — BENZTROPINE MESYLATE 1 MG PO TABS
1.0000 mg | ORAL_TABLET | Freq: Two times a day (BID) | ORAL | Status: DC
  Administered 2019-05-28 – 2019-06-03 (×11): 1 mg via ORAL
  Filled 2019-05-27 (×12): qty 1

## 2019-05-27 MED ORDER — TRAZODONE HCL 100 MG PO TABS
300.0000 mg | ORAL_TABLET | Freq: Every day | ORAL | Status: DC
  Administered 2019-05-28 – 2019-06-02 (×6): 300 mg via ORAL
  Filled 2019-05-27 (×6): qty 3

## 2019-05-27 MED ORDER — FLUOXETINE HCL 20 MG PO CAPS
20.0000 mg | ORAL_CAPSULE | Freq: Every day | ORAL | Status: DC
  Administered 2019-05-28 – 2019-06-03 (×5): 20 mg via ORAL
  Filled 2019-05-27 (×7): qty 1

## 2019-05-27 MED ORDER — OMEGA-3-ACID ETHYL ESTERS 1 G PO CAPS
1.0000 g | ORAL_CAPSULE | Freq: Two times a day (BID) | ORAL | Status: DC
  Administered 2019-05-28 – 2019-06-03 (×10): 1 g via ORAL
  Filled 2019-05-27 (×13): qty 1
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
## 2019-05-27 NOTE — Progress Notes (Signed)
Pt has remained sleep much of the evening. Pt paranoid
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
## 2019-05-28 DIAGNOSIS — F200 Paranoid schizophrenia: Secondary | ICD-10-CM | POA: Diagnosis not present

## 2019-05-28 MED ORDER — HALOPERIDOL 5 MG PO TABS
10.0000 mg | ORAL_TABLET | Freq: Two times a day (BID) | ORAL | Status: DC
  Administered 2019-05-28 – 2019-06-03 (×10): 10 mg via ORAL
  Filled 2019-05-28 (×11): qty 2

## 2019-05-28 NOTE — Progress Notes (Signed)
Teejay observe in room, seen resting in bed with eyes open. He presents with thought-blocking, paranoia, and appears to be responding to internal stimuli. No new c/o's. Fluids and snacks offered and accepted. Will continue with POC.

## 2019-05-28 NOTE — BH Assessment (Signed)
[facility] Assessment Progress Note  Per Greg Clary, MD, this pt is to remain in the [facility] Unit overnight for further observation and stabilization.  Pt's nurse has been notified.  Magaby Rumberger, MA Behavioral Health Coordinator [phone]     
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
## 2019-05-28 NOTE — Progress Notes (Signed)
[place] NOVEL CORONAVIRUS (COVID-19) DAILY CHECK-OFF SYMPTOMS - answer yes or no to each - every day NO YES  Have you had a fever in the past 24 hours?  . Fever (Temp > 37.80C / 100F) X   Have you had any of these symptoms in the past 24 hours? . New Cough .  Sore Throat  .  Shortness of Breath .  Difficulty Breathing .  Unexplained Body Aches   X   Have you had any one of these symptoms in the past 24 hours not related to allergies?   . Runny Nose .  Nasal Congestion .  Sneezing   X   If you have had runny nose, nasal congestion, sneezing in the past 24 hours, has it worsened?  X   EXPOSURES - check yes or no X   Have you traveled outside the state in the past 14 days?  X   Have you been in contact with someone with a confirmed diagnosis of COVID-19 or PUI in the past 14 days without wearing appropriate PPE?  X   Have you been living in the same home as a person with confirmed diagnosis of COVID-19 or a PUI (household contact)?    X   Have you been diagnosed with COVID-19?    X              What to do next: Answered NO to all: Answered YES to anything:   Proceed with unit schedule Follow the [facility] Inpatient Flowsheet.   

## 2019-05-29 LAB — CBC WITH DIFFERENTIAL/PLATELET
Abs Immature Granulocytes: 0.01 10*3/uL (ref 0.00–0.07)
Basophils Absolute: 0.1 10*3/uL (ref 0.0–0.1)
Basophils Relative: 2 %
Eosinophils Absolute: 0.1 10*3/uL (ref 0.0–0.5)
Eosinophils Relative: 1 %
HCT: 43.3 % (ref 39.0–52.0)
Hemoglobin: 13.8 g/dL (ref 13.0–17.0)
Immature Granulocytes: 0 %
Lymphocytes Relative: 28 %
Lymphs Abs: 1.1 10*3/uL (ref 0.7–4.0)
MCH: 31.7 pg (ref 26.0–34.0)
MCHC: 31.9 g/dL (ref 30.0–36.0)
MCV: 99.3 fL (ref 80.0–100.0)
Monocytes Absolute: 0.6 10*3/uL (ref 0.1–1.0)
Monocytes Relative: 14 %
Neutro Abs: 2.2 10*3/uL (ref 1.7–7.7)
Neutrophils Relative %: 55 %
Platelets: 365 10*3/uL (ref 150–400)
RBC: 4.36 MIL/uL (ref 4.22–5.81)
RDW: 11.2 % — ABNORMAL LOW (ref 11.5–15.5)
WBC: 4 10*3/uL (ref 4.0–10.5)
nRBC: 0 % (ref 0.0–0.2)

## 2019-05-29 LAB — COMPREHENSIVE METABOLIC PANEL
ALT: 14 U/L (ref 0–44)
AST: 16 U/L (ref 15–41)
Albumin: 4.2 g/dL (ref 3.5–5.0)
Alkaline Phosphatase: 52 U/L (ref 38–126)
Anion gap: 12 (ref 5–15)
BUN: 17 mg/dL (ref 6–20)
CO2: 24 mmol/L (ref 22–32)
Calcium: 9.3 mg/dL (ref 8.9–10.3)
Chloride: 105 mmol/L (ref 98–111)
Creatinine, Ser: 0.95 mg/dL (ref 0.61–1.24)
GFR calc Af Amer: >60 mL/min (ref >60)
GFR calc non Af Amer: >60 mL/min (ref >60)
Glucose, Bld: 97 mg/dL (ref 70–99)
Potassium: 3.3 mmol/L — ABNORMAL LOW (ref 3.5–5.1)
Sodium: 141 mmol/L (ref 135–145)
Total Bilirubin: 0.6 mg/dL (ref 0.3–1.2)
Total Protein: 7.2 g/dL (ref 6.5–8.1)

## 2019-05-29 LAB — TSH: TSH: 0.777 u[IU]/mL (ref 0.350–4.500)

## 2019-05-29 NOTE — Progress Notes (Signed)
UA cup provided. Pt made aware. Pending sample.  

## 2019-05-29 NOTE — BH Assessment (Signed)
[facility] Assessment Progress Note  Per Greg Clary, MD, this pt requires psychiatric hospitalization and is to be referred to [facility].  Dr Clary also finds that pt meets criteria for IVC, which he has initiated.  IVC documents have been faxed to [place] Magistrate.  This writer is now at a different campus from where pt is located, and it is unknown whether Findings and Custody Order has been faxed back.  I have completed Regional Referral for pt, but have not contacted the [facility] for authorization, nor have I faxed or called [facility] to initiate referral.  Sarah Marshall, CSW agrees to follow up with these details.  Reinhardt Licausi, MA Behavioral Health Coordinator [phone]     

## 2019-05-29 NOTE — Progress Notes (Signed)
Patient ID: [name] [name], male   DOB: May 27, 1996, 23 y.o.   MRN: [id number] Pt awake, alert & responsive, no distress noted, resting at present. Pt under IVC.  Pt refusing po meds.  Monitoring for safety.

## 2019-05-29 NOTE — Progress Notes (Signed)
CSW completed state hospital referral packet and faxed the documents to [facility] for authorization.   [place] Reusing number: [id number]  Completed state referral packet has been faxed to [facility] for review.  Audree Camel, LCSW, [place] Disposition [facility]/TTS [phone]

## 2019-05-30 DIAGNOSIS — F200 Paranoid schizophrenia: Secondary | ICD-10-CM | POA: Diagnosis not present

## 2019-05-30 NOTE — Progress Notes (Signed)
Patient is on the waitlist for [facility] per A/C Mary, RN@[facility]  Shatoya Roets T. Jaston Havens, MSW, LCSW Disposition Clinical Social Work [phone] (cell) [phone] (office)  

## 2019-05-30 NOTE — Progress Notes (Signed)
Patient ID: [name] [name], male   DOB: 1996-05-17, 23 y.o.   MRN: [id number] Pt awake, alert & responsive, no distress noted, resting at present.  Monitoring for safety.

## 2019-05-30 NOTE — Progress Notes (Signed)
[facility] MD Progress Note  05/30/2019 11:19 AM [name] MinorsLancine Kuhlman  MRN:  [id number]   Subjective: Pt stated he does not know why he is here. He stated he and his father argue but he would not specify about what.  Pt was seen and chart reviewed with treatment team and Dr [name]. Pt denies suicidal/homicidal ideation, denies auditory/visual hallucinations and does not appear to be responding to internal stimuli.   On evaluation he stated he does not take his medications when he leaves the hospital.  [facility] and has been off meds since then.  Patient appears to be thought blocking, he is smiling inappropriately and appears to be responding to internal stimuli. .  Dr. [name] informs that patient is at his baseline but would be good to get patient restarted on his medications prior to discharging back home.  Patient missed his appointment at [facility] yesterday and should have the appointment rescheduled.  Patient reports that he slept well last night.  Pt is being recommended for a [facility] admission due to repeated hospitalizations and medication noncompliance which cause him to become aggressive with his family.     Principal Problem: Schizophrenia (HCC) Diagnosis: Principal Problem:   Schizophrenia (HCC)  Total Time spent with patient: 30 minutes  Past Psychiatric History: Schizophrenia  Past Medical History: History reviewed. No pertinent past medical history. History reviewed. No pertinent surgical history. Family History: History reviewed. No pertinent family history. Family Psychiatric  History: Unaware Social History:  Social History   Substance and Sexual Activity  Alcohol Use No     Social History   Substance and Sexual Activity  Drug Use No    Social History   Socioeconomic History  . Marital status: Single    Spouse name: Not on file  . Number of children: Not on file  . Years of education: Not on file  . Highest education level: Not on file  Occupational History  . Not  on file  Social Needs  . Financial resource strain: Not on file  . Food insecurity    Worry: Not on file    Inability: Not on file  . Transportation needs    Medical: Not on file    Non-medical: Not on file  Tobacco Use  . Smoking status: Never Smoker  . Smokeless tobacco: Never Used  Substance and Sexual Activity  . Alcohol use: No  . Drug use: No  . Sexual activity: Never  Lifestyle  . Physical activity    Days per week: Not on file    Minutes per session: Not on file  . Stress: Not on file  Relationships  . Social Musicianconnections    Talks on phone: Not on file    Gets together: Not on file    Attends religious service: Not on file    Active member of club or organization: Not on file    Attends meetings of clubs or organizations: Not on file    Relationship status: Not on file  Other Topics Concern  . Not on file  Social History Narrative  . Not on file   Additional Social History:    Pain Medications: see MAR Prescriptions: see MAR Over the Counter: see MAR History of alcohol / drug use?: No history of alcohol / drug abuse Longest period of sobriety (when/how long): N/A    Sleep: Good  Appetite:  Good  Current Medications: Current Facility-Administered Medications  Medication Dose Route Frequency Provider Last Rate Last Dose  .  benztropine (COGENTIN) tablet 1 mg  1 mg Oral BID Rankin, Shuvon B, NP   1 mg at 05/30/19 0929  . FLUoxetine (PROZAC) capsule 20 mg  20 mg Oral Daily Rankin, Shuvon B, NP   20 mg at 05/30/19 0929  . haloperidol (HALDOL) tablet 10 mg  10 mg Oral BID Rankin, Shuvon B, NP   10 mg at 05/30/19 0930  . omega-3 acid ethyl esters (LOVAZA) capsule 1 g  1 g Oral BID Rankin, Shuvon B, NP   1 g at 05/28/19 1619  . traZODone (DESYREL) tablet 300 mg  300 mg Oral QHS Rankin, Shuvon B, NP   300 mg at 05/29/19 2142    Lab Results:  Results for orders placed or performed during the hospital encounter of 05/27/19 (from the past 48 hour(s))  CBC with  Differential/Platelet     Status: Abnormal   Collection Time: 05/29/19  6:26 PM  Result Value Ref Range   WBC 4.0 4.0 - 10.5 K/uL   RBC 4.36 4.22 - 5.81 MIL/uL   Hemoglobin 13.8 13.0 - 17.0 g/dL   HCT 16.143.3 09.639.0 - 04.552.0 %   MCV 99.3 80.0 - 100.0 fL   MCH 31.7 26.0 - 34.0 pg   MCHC 31.9 30.0 - 36.0 g/dL   RDW 40.911.2 (L) 81.111.5 - 91.415.5 %   Platelets 365 150 - 400 K/uL   nRBC 0.0 0.0 - 0.2 %   Neutrophils Relative % 55 %   Neutro Abs 2.2 1.7 - 7.7 K/uL   Lymphocytes Relative 28 %   Lymphs Abs 1.1 0.7 - 4.0 K/uL   Monocytes Relative 14 %   Monocytes Absolute 0.6 0.1 - 1.0 K/uL   Eosinophils Relative 1 %   Eosinophils Absolute 0.1 0.0 - 0.5 K/uL   Basophils Relative 2 %   Basophils Absolute 0.1 0.0 - 0.1 K/uL   Immature Granulocytes 0 %   Abs Immature Granulocytes 0.01 0.00 - 0.07 K/uL    Comment: Performed at [facility], [address] [place], [place] [address]  Comprehensive metabolic panel     Status: Abnormal   Collection Time: 05/29/19  6:26 PM  Result Value Ref Range   Sodium 141 135 - 145 mmol/L   Potassium 3.3 (L) 3.5 - 5.1 mmol/L   Chloride 105 98 - 111 mmol/L   CO2 24 22 - 32 mmol/L   Glucose, Bld 97 70 - 99 mg/dL   BUN 17 6 - 20 mg/dL   Creatinine, Ser 6.210.95 0.61 - 1.24 mg/dL   Calcium 9.3 8.9 - 30.810.3 mg/dL   Total Protein 7.2 6.5 - 8.1 g/dL   Albumin 4.2 3.5 - 5.0 g/dL   AST 16 15 - 41 U/L   ALT 14 0 - 44 U/L   Alkaline Phosphatase 52 38 - 126 U/L   Total Bilirubin 0.6 0.3 - 1.2 mg/dL   GFR calc non Af Amer >60 >60 mL/min   GFR calc Af Amer >60 >60 mL/min   Anion gap 12 5 - 15    Comment: Performed at [facility], [address] [place], [place] [address]  TSH     Status: None   Collection Time: 05/29/19  6:26 PM  Result Value Ref Range   TSH 0.777 0.350 - 4.500 uIU/mL    Comment: Performed by a 3rd Generation assay with a functional sensitivity of <=0.01 uIU/mL. Performed at [facility], [address] [place], [place] [address]  Blood Alcohol level:  Lab Results  Component Value Date   ETH <10 34/19/6222    Metabolic Disorder Labs: Lab Results  Component Value Date   HGBA1C 4.0 (L) 05/13/2019   MPG 68.1 05/13/2019   Lab Results  Component Value Date   PROLACTIN 37.5 (H) 05/13/2019   Lab Results  Component Value Date   CHOL 180 05/13/2019   TRIG 38 05/13/2019   HDL 45 05/13/2019   CHOLHDL 4.0 05/13/2019   VLDL 8 05/13/2019   LDLCALC 127 (H) 05/13/2019    Physical Findings: AIMS: Facial and Oral Movements Muscles of Facial Expression: None, normal Lips and Perioral Area: None, normal Jaw: None, normal Tongue: None, normal,Extremity Movements Upper (arms, wrists, hands, fingers): None, normal Lower (legs, knees, ankles, toes): None, normal, Trunk Movements Neck, shoulders, hips: None, normal, Overall Severity Severity of abnormal movements (highest score from questions above): None, normal Incapacitation due to abnormal movements: None, normal Patient's awareness of abnormal movements (rate only patient's report): No Awareness, Dental Status Current problems with teeth and/or dentures?: No Does patient usually wear dentures?: No  CIWA:  CIWA-Ar Total: 0 COWS:  COWS Total Score: 0  Musculoskeletal: Strength & Muscle Tone: within normal limits Gait & Station: normal Patient leans: N/A  Psychiatric Specialty Exam: Physical Exam  ROS  Blood pressure (!) 103/59, pulse (!) 57, temperature (!) 97.5 F (36.4 C), temperature source Oral, resp. rate 16, SpO2 100 %.There is no height or weight on file to calculate BMI.  General Appearance: Casual  Eye Contact:  Good  Speech:  Blocked  Volume:  Normal  Mood:  Depressed  Affect:  Congruent  Thought Process:  Coherent  Orientation:  Full (Time, Place, and Person)  Thought Content:  Patient denies hallucinations, delusions, and paranoia, appears to be responding to internal stimuli  Suicidal Thoughts:  No, denies   Homicidal Thoughts:  No, denies  Memory:  Immediate;   Fair Recent;   Fair  Judgement:  Fair  Insight:  Fair  Psychomotor Activity:  Normal  Concentration:  Concentration: Fair  Recall:  [employer] of Knowledge:  Fair  Language:  Fair  Akathisia:  No  Handed:  Right  AIMS (if indicated):     Assets:  Desire for Improvement Housing Social Support  ADL's:  Intact  Cognition:  WNL  Sleep:      Treatment Plan Summary: Daily contact with patient to assess and evaluate symptoms and progress in treatment, Medication management and Plan Medication management and continue to observe   Medication management:  Restarted home medications.  Increased Haldol to 10 mg Bid  Ethelene Hal, NP 05/30/2019, 11:19 AM

## 2019-05-31 DIAGNOSIS — F200 Paranoid schizophrenia: Secondary | ICD-10-CM | POA: Diagnosis not present

## 2019-05-31 LAB — SARS CORONAVIRUS 2 BY RT PCR (HOSPITAL ORDER, PERFORMED IN ~~LOC~~ HOSPITAL LAB): SARS Coronavirus 2: NEGATIVE

## 2019-05-31 NOTE — Progress Notes (Signed)
Pt slept through out the night, no distress noted, calm & cooperative, guarded.  Monitoring for safety.

## 2019-05-31 NOTE — Progress Notes (Addendum)
[facility]  05/31/2019 3:23 PM [name] MinorsLancine [name]  MRN:  [id number]   Subjective: Pt stated he does not know why he is here just stated "I smoked some weed."  Pt was seen and chart reviewed with treatment team and Dr. Jannifer FranklinAkintayo. Pt denies suicidal/homicidal ideation, denies auditory/visual hallucinations. However, he has been observed by unit staff as responding to internal stimuli. On evaluation he stated he does not take his medications when he leaves the hospital.  [facility] and has been off meds since then. Patient reports that he slept well last night.  Pt is being recommended for a [facility] admission due to repeated hospitalizations and medication noncompliance which cause him to become aggressive with his family.     Principal Problem: Schizophrenia (HCC) Diagnosis: Principal Problem:   Schizophrenia (HCC)  Total Time spent with patient: 15 minutes  Past Psychiatric History: Schizophrenia  Past Medical History: History reviewed. No pertinent past medical history. History reviewed. No pertinent surgical history. Family History: History reviewed. No pertinent family history. Family Psychiatric  History: Unaware Social History:  Social History   Substance and Sexual Activity  Alcohol Use No     Social History   Substance and Sexual Activity  Drug Use No    Social History   Socioeconomic History  . Marital status: Single    Spouse name: Not on file  . Number of children: Not on file  . Years of education: Not on file  . Highest education level: Not on file  Occupational History  . Not on file  Social Needs  . Financial resource strain: Not on file  . Food insecurity    Worry: Not on file    Inability: Not on file  . Transportation needs    Medical: Not on file    Non-medical: Not on file  Tobacco Use  . Smoking status: Never Smoker  . Smokeless tobacco: Never Used  Substance and Sexual Activity  . Alcohol use: No  . Drug use: No  . Sexual  activity: Never  Lifestyle  . Physical activity    Days per week: Not on file    Minutes per session: Not on file  . Stress: Not on file  Relationships  . Social Musicianconnections    Talks on phone: Not on file    Gets together: Not on file    Attends religious service: Not on file    Active member of club or organization: Not on file    Attends meetings of clubs or organizations: Not on file    Relationship status: Not on file  Other Topics Concern  . Not on file  Social History Narrative  . Not on file   Additional Social History:    Pain Medications: see MAR Prescriptions: see MAR Over the Counter: see MAR History of alcohol / drug use?: No history of alcohol / drug abuse Longest period of sobriety (when/how long): N/A    Sleep: Good  Appetite:  Good  Current Medications: Current Facility-Administered Medications  Medication Dose Route Frequency Provider Last Rate Last Dose  . benztropine (COGENTIN) tablet 1 mg  1 mg Oral BID Rankin, Shuvon B, NP   1 mg at 05/30/19 2127  . FLUoxetine (PROZAC) capsule 20 mg  20 mg Oral Daily Rankin, Shuvon B, NP   20 mg at 05/30/19 0929  . haloperidol (HALDOL) tablet 10 mg  10 mg Oral BID Rankin, Shuvon B, NP   10 mg at 05/30/19  2127  . omega-3 acid ethyl esters (LOVAZA) capsule 1 g  1 g Oral BID Rankin, Shuvon B, NP   1 g at 05/30/19 2128  . traZODone (DESYREL) tablet 300 mg  300 mg Oral QHS Rankin, Shuvon B, NP   300 mg at 05/30/19 2126    Lab Results:  Results for orders placed or performed during the hospital encounter of 05/27/19 (from the past 48 hour(s))  CBC with Differential/Platelet     Status: Abnormal   Collection Time: 05/29/19  6:26 PM  Result Value Ref Range   WBC 4.0 4.0 - 10.5 K/uL   RBC 4.36 4.22 - 5.81 MIL/uL   Hemoglobin 13.8 13.0 - 17.0 g/dL   HCT 16.143.3 09.639.0 - 04.552.0 %   MCV 99.3 80.0 - 100.0 fL   MCH 31.7 26.0 - 34.0 pg   MCHC 31.9 30.0 - 36.0 g/dL   RDW 40.911.2 (L) 81.111.5 - 91.415.5 %   Platelets 365 150 - 400 K/uL    nRBC 0.0 0.0 - 0.2 %   Neutrophils Relative % 55 %   Neutro Abs 2.2 1.7 - 7.7 K/uL   Lymphocytes Relative 28 %   Lymphs Abs 1.1 0.7 - 4.0 K/uL   Monocytes Relative 14 %   Monocytes Absolute 0.6 0.1 - 1.0 K/uL   Eosinophils Relative 1 %   Eosinophils Absolute 0.1 0.0 - 0.5 K/uL   Basophils Relative 2 %   Basophils Absolute 0.1 0.0 - 0.1 K/uL   Immature Granulocytes 0 %   Abs Immature Granulocytes 0.01 0.00 - 0.07 K/uL    Comment: Performed at [facility], [address] [place], [place] [address]  Comprehensive metabolic panel     Status: Abnormal   Collection Time: 05/29/19  6:26 PM  Result Value Ref Range   Sodium 141 135 - 145 mmol/L   Potassium 3.3 (L) 3.5 - 5.1 mmol/L   Chloride 105 98 - 111 mmol/L   CO2 24 22 - 32 mmol/L   Glucose, Bld 97 70 - 99 mg/dL   BUN 17 6 - 20 mg/dL   Creatinine, Ser 6.210.95 0.61 - 1.24 mg/dL   Calcium 9.3 8.9 - 30.810.3 mg/dL   Total Protein 7.2 6.5 - 8.1 g/dL   Albumin 4.2 3.5 - 5.0 g/dL   AST 16 15 - 41 U/L   ALT 14 0 - 44 U/L   Alkaline Phosphatase 52 38 - 126 U/L   Total Bilirubin 0.6 0.3 - 1.2 mg/dL   GFR calc non Af Amer >60 >60 mL/min   GFR calc Af Amer >60 >60 mL/min   Anion gap 12 5 - 15    Comment: Performed at [facility], [address] [place], [place] [address]  TSH     Status: None   Collection Time: 05/29/19  6:26 PM  Result Value Ref Range   TSH 0.777 0.350 - 4.500 uIU/mL    Comment: Performed by a 3rd Generation assay with a functional sensitivity of <=0.01 uIU/mL. Performed at [facility], [address] [place], [place] [address]   SARS Coronavirus 2 [facility](Hospital order, Performed in [facility] hospital lab)     Status: None   Collection Time: 05/31/19  6:30 AM  Result Value Ref Range   SARS Coronavirus 2 NEGATIVE NEGATIVE    Comment: (NOTE) If result is NEGATIVE SARS-CoV-2 target nucleic acids are NOT DETECTED. The SARS-CoV-2 RNA is generally detectable in upper  and lower  respiratory specimens  during the acute phase of infection. The lowest  concentration of SARS-CoV-2 viral copies this assay can detect is 250  copies / mL. A negative result does not preclude SARS-CoV-2 infection  and should not be used as the sole basis for treatment or other  patient management decisions.  A negative result may occur with  improper specimen collection / handling, submission of specimen other  than nasopharyngeal swab, presence of viral mutation(s) within the  areas targeted by this assay, and inadequate number of viral copies  (<250 copies / mL). A negative result must be combined with clinical  observations, patient history, and epidemiological information. If result is POSITIVE SARS-CoV-2 target nucleic acids are DETECTED. The SARS-CoV-2 RNA is generally detectable in upper and lower  respiratory specimens dur ing the acute phase of infection.  Positive  results are indicative of active infection with SARS-CoV-2.  Clinical  correlation with patient history and other diagnostic information is  necessary to determine patient infection status.  Positive results do  not rule out bacterial infection or co-infection with other viruses. If result is PRESUMPTIVE POSTIVE SARS-CoV-2 nucleic acids MAY BE PRESENT.   A presumptive positive result was obtained on the submitted specimen  and confirmed on repeat testing.  While 2019 novel coronavirus  (SARS-CoV-2) nucleic acids may be present in the submitted sample  additional confirmatory testing may be necessary for epidemiological  and / or clinical management purposes  to differentiate between  SARS-CoV-2 and other Sarbecovirus currently known to infect humans.  If clinically indicated additional testing with an alternate test  methodology [phone]) is advised. The SARS-CoV-2 RNA is generally  detectable in upper and lower respiratory sp ecimens during the acute  phase of infection. The expected result is  Negative. Fact Sheet for Patients:  StrictlyIdeas.no Fact Sheet for Healthcare Providers: BankingDealers.co.za This test is not yet approved or cleared by the [place] FDA and has been authorized for detection and/or diagnosis of SARS-CoV-2 by FDA under an Emergency Use Authorization (EUA).  This EUA will remain in effect (meaning this test can be used) for the duration of the COVID-19 declaration under Section 564(b)(1) of the Act, 21 U.S.C. section 360bbb-3(b)(1), unless the authorization is terminated or revoked sooner. Performed at [facility], [place] [address] [place], [place] [address]     Blood Alcohol level:  Lab Results  Component Value Date   ETH <10 25/04/3975    Metabolic Disorder Labs: Lab Results  Component Value Date   HGBA1C 4.0 (L) 05/13/2019   MPG 68.1 05/13/2019   Lab Results  Component Value Date   PROLACTIN 37.5 (H) 05/13/2019   Lab Results  Component Value Date   CHOL 180 05/13/2019   TRIG 38 05/13/2019   HDL 45 05/13/2019   CHOLHDL 4.0 05/13/2019   VLDL 8 05/13/2019   LDLCALC 127 (H) 05/13/2019    Physical Findings: AIMS: Facial and Oral Movements Muscles of Facial Expression: None, normal Lips and Perioral Area: None, normal Jaw: None, normal Tongue: None, normal,Extremity Movements Upper (arms, wrists, hands, fingers): None, normal Lower (legs, knees, ankles, toes): None, normal, Trunk Movements Neck, shoulders, hips: None, normal, Overall Severity Severity of abnormal movements (highest score from questions above): None, normal Incapacitation due to abnormal movements: None, normal Patient's awareness of abnormal movements (rate only patient's report): No Awareness, Dental Status Current problems with teeth and/or dentures?: No Does patient usually wear dentures?: No  CIWA:  CIWA-Ar Total: 0 COWS:  COWS Total Score: 0  Musculoskeletal:  Strength & Muscle Tone:  within normal limits Gait & Station: normal Patient leans: N/A  Psychiatric Specialty Exam: Physical Exam  Psychiatric: His speech is delayed. He is withdrawn. Thought content is delusional. Cognition and memory are impaired. He expresses impulsivity. He exhibits a depressed mood.    ROS  Blood pressure (!) 103/59, pulse (!) 57, temperature (!) 97.5 F (36.4 C), temperature source Oral, resp. rate 16, SpO2 100 %.There is no height or weight on file to calculate BMI.  General Appearance: Casual  Eye Contact:  Minimal  Speech:  Clear and Coherent  Volume:  Decreased  Mood:  Depressed  Affect:  Congruent  Thought Process:  Coherent  Orientation:  Full (Time, Place, and Person)  Thought Content:  Patient denies hallucinations, delusions, and paranoia, appears to be responding to internal stimuli  Suicidal Thoughts:  No, denies  Homicidal Thoughts:  No, denies  Memory:  Immediate;   Fair Recent;   Fair  Judgement:  Fair  Insight:  Fair  Psychomotor Activity:  Normal  Concentration:  Concentration: Fair  Recall:  [employer]  Fund of Knowledge:  Fair  Language:  Fair  Akathisia:  No  Handed:  Right  AIMS (if indicated):     Assets:  Desire for Improvement Housing Social Support  ADL's:  Intact  Cognition:  WNL  Sleep:      Treatment Plan Summary: Daily contact with patient to assess and evaluate symptoms and progress in treatment.  [facility].    Medication management:  Restarted home medications. Continue Haldol to 10 mg Bid for psychosis.   Fransisca KaufmannAVIS, LAURA, NP 05/31/2019, 3:23 PM  Patient seen face-to-face for psychiatric evaluation, chart reviewed and case discussed with the physician extender and developed treatment plan. Reviewed the information documented and agree with the treatment plan. Thedore MinsMojeed Kierrah Kilbride, MD

## 2019-05-31 NOTE — Progress Notes (Signed)
Patient ID: [name] [name], male   DOB: 1996-10-26, 23 y.o.   MRN: [id number] Pt awake, alert & responsive, no distress noted, resting at present.  Calm & cooperative.  Monitoring for safety.

## 2019-06-01 DIAGNOSIS — F200 Paranoid schizophrenia: Secondary | ICD-10-CM

## 2019-06-01 NOTE — Progress Notes (Signed)
Patient ID: [name] [name], male   DOB: January 30, 1996, 23 y.o.   MRN: [id number]   D:Patient was observed in bed with his eyes open. Patient did sit up to talk with this Probation officer. Patient denies SI/HI and A/V hallucinations. Patient is calm and cooperative. He voices no concerns. He appears to be preoccupied in his thoughts; however he denies A/V hallucinations.  A:Patient is taking medications as prescribed. He remains in bed all morning. Patient provided support and encouragement. Q 15 minute checks in progress. R:Patient remains safe on unit. Monitoring continues.

## 2019-06-01 NOTE — Progress Notes (Signed)
Patient meets criteria for inpatient treatment. No appropriate or available beds at [facility]. CSW faxed referrals to the following facilities for review:  [facility], [facility], [place] [facility], [place], [place], [place], [place], [place], and [place].  TTS will continue to seek bed placement.  Chalmers Guest. Guerry Bruin, MSW, [place] Work/Disposition Phone: [phone] Fax: [phone]
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
## 2019-06-02 DIAGNOSIS — F200 Paranoid schizophrenia: Secondary | ICD-10-CM | POA: Diagnosis not present

## 2019-06-02 LAB — URINALYSIS, COMPLETE (UACMP) WITH MICROSCOPIC
Bacteria, UA: NONE SEEN
Bilirubin Urine: NEGATIVE
Glucose, UA: NEGATIVE mg/dL
Hgb urine dipstick: NEGATIVE
Ketones, ur: NEGATIVE mg/dL
Leukocytes,Ua: NEGATIVE
Nitrite: NEGATIVE
Protein, ur: NEGATIVE mg/dL
Specific Gravity, Urine: 1.026 (ref 1.005–1.030)
pH: 6 (ref 5.0–8.0)

## 2019-06-02 LAB — RAPID URINE DRUG SCREEN, HOSP PERFORMED
Amphetamines: NOT DETECTED
Barbiturates: NOT DETECTED
Benzodiazepines: NOT DETECTED
Cocaine: NOT DETECTED
Opiates: NOT DETECTED
Tetrahydrocannabinol: NOT DETECTED
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
## 2019-06-02 NOTE — Progress Notes (Signed)
D:  [name] [name] was in the day room later in the evening.  He was pleasant and cooperative.  He denied SI/HI or A/V hallucinations.  Affect was flat.  He took his hs medications without difficulty.   A:  1:1 with RN for support and encouragement.  Medications as ordered.  Q 15 minute checks maintained for safety.  Encouraged participation in group and unit activities.   R:  [name] [name] remains safe on the unit.  We will continue to monitor the progress towards his goals.

## 2019-06-02 NOTE — Progress Notes (Signed)
Specimen cup placed in bathroom for sample.  Pt states that he already provided urine sample.  Old specimen cup found in his bathroom.  Unknown sample date and time.  Encouraged new sample.

## 2019-06-02 NOTE — Progress Notes (Signed)
   06/02/19 2230  COVID-19 Daily Checkoff  Have you had a fever (temp > 37.80C/100F)  in the past 24 hours?  No  If you have had runny nose, nasal congestion, sneezing in the past 24 hours, has it worsened? No  COVID-19 EXPOSURE  Have you traveled outside the state in the past 14 days? No  Have you been in contact with someone with a confirmed diagnosis of COVID-19 or PUI in the past 14 days without wearing appropriate PPE? No  Have you been living in the same home as a person with confirmed diagnosis of COVID-19 or a PUI (household contact)? No  Have you been diagnosed with COVID-19? No

## 2019-06-03 DIAGNOSIS — F200 Paranoid schizophrenia: Secondary | ICD-10-CM | POA: Diagnosis not present

## 2019-06-03 MED ORDER — HALOPERIDOL 10 MG PO TABS: 10.0000 mg | ORAL_TABLET | Freq: Two times a day (BID) | ORAL | 0 refills | Status: AC

## 2019-06-03 NOTE — Plan of Care (Signed)
[facility] Plan  Reason for Crisis Plan:  Crisis Stabilization   Plan of Care:  Referral for Telepsychiatry/Psychiatric Consult  Family Support:      Current Living Environment:  Living Arrangements: Parent  Insurance:   Hospital Account    Name Acct ID Class Status Primary Coverage   [name] [name] [id number] [place] - BCBS COMM PPO        Guarantor Account (for Hospital Account [id number])    Name Relation to Pt Service Area Active? Acct Type   [name] [name] Self [facility] Yes Behavioral Health   Address Phone       [address] [place], [place] [address] [phone])          Coverage Information (for Hospital Account [id number])    F/O Payor/Plan Precert #   BLUE CROSS BLUE SHIELD/BCBS COMM PPO REF # [id number]   Subscriber Subscriber #   [name] [name], [name] [name] [id number]   Address Phone   [address] [place], [place] [address] [phone]      Legal Guardian:  Legal Guardian: Father  Primary Care Provider:  Patient, No Pcp Per  Current Outpatient Providers:  [facility]  Psychiatrist:  Name of Psychiatrist: [facility]  Counselor/Therapist:  Name of Therapist: none  Compliant with Medications:  Yes  Additional Information:   [name] [name] 6/30/20204:58 PM

## 2019-06-03 NOTE — Discharge Instructions (Signed)
For your mental health needs, you are advised to follow up with [facility].  Call them at your earliest opportunity to schedule an intake appointment: ° °     [facility] °     [address] °     [place], [place] [address] °     [phone] °     Crisis number: [phone] °

## 2019-06-03 NOTE — Progress Notes (Addendum)
Patient ID: [name] [name], male   DOB: [birth year], 23 y.o.   MRN: [id number] [facility] MD Progress Note  06/03/2019 4:02 PM [name] [name]  MRN:  [id number]   Subjective: Patient states that he is doing good.  Patient reports that he is eating/sleeping without difficulty, tolerating medications without adverse reactions.   Patient seen face to face by this provider and Dr. Sharma CovertNorman; chart reviewed on 06/03/19.  Patient has improved; speech still delayed but has improved.  Patient has been in day room interacting with peers; there have been no behavioral or verbal outburst.  Patient has been compliant with medications and taken them as ordered.  Patient denies suicidal/self-harm/homicidal ideation, psychosis, and paranoia.  Patient spoke to his father today who has agreed to pick him up.     Principal Problem: Schizophrenia (HCC) Diagnosis: Principal Problem:   Schizophrenia (HCC)  Total Time spent with patient: 15 minutes  Past Psychiatric History: Schizophrenia  Past Medical History: History reviewed. No pertinent past medical history. History reviewed. No pertinent surgical history. Family History: History reviewed. No pertinent family history. Family Psychiatric  History: Unaware Social History:  Social History   Substance and Sexual Activity  Alcohol Use No     Social History   Substance and Sexual Activity  Drug Use No    Social History   Socioeconomic History  . Marital status: Single    Spouse name: Not on file  . Number of children: Not on file  . Years of education: Not on file  . Highest education level: Not on file  Occupational History  . Not on file  Social Needs  . Financial resource strain: Not on file  . Food insecurity    Worry: Not on file    Inability: Not on file  . Transportation needs    Medical: Not on file    Non-medical: Not on file  Tobacco Use  . Smoking status: Never Smoker  . Smokeless tobacco: Never Used  Substance and Sexual Activity  . Alcohol  use: No  . Drug use: No  . Sexual activity: Never  Lifestyle  . Physical activity    Days per week: Not on file    Minutes per session: Not on file  . Stress: Not on file  Relationships  . Social Musicianconnections    Talks on phone: Not on file    Gets together: Not on file    Attends religious service: Not on file    Active member of club or organization: Not on file    Attends meetings of clubs or organizations: Not on file    Relationship status: Not on file  Other Topics Concern  . Not on file  Social History Narrative  . Not on file   Additional Social History: N/A   Pain Medications: see MAR Prescriptions: see MAR Over the Counter: see MAR History of alcohol / drug use?: No history of alcohol / drug abuse Longest period of sobriety (when/how long): N/A    Sleep: Good  Appetite:  Good  Current Medications: Current Facility-Administered Medications  Medication Dose Route Frequency Provider Last Rate Last Dose  . benztropine (COGENTIN) tablet 1 mg  1 mg Oral BID Rankin, Shuvon B, NP   1 mg at 06/03/19 1136  . FLUoxetine (PROZAC) capsule 20 mg  20 mg Oral Daily Rankin, Shuvon B, NP   20 mg at 06/03/19 1136  . haloperidol (HALDOL) tablet 10 mg  10 mg Oral BID Rankin, Shuvon B, NP   10  mg at 06/03/19 1136  . omega-3 acid ethyl esters (LOVAZA) capsule 1 g  1 g Oral BID Rankin, Shuvon B, NP   1 g at 06/03/19 1135  . traZODone (DESYREL) tablet 300 mg  300 mg Oral QHS Rankin, Shuvon B, NP   300 mg at 06/02/19 2307    Lab Results:  Results for orders placed or performed during the hospital encounter of 05/27/19 (from the past 48 hour(s))  Urinalysis, Complete w Microscopic     Status: None   Collection Time: 06/02/19  6:26 PM  Result Value Ref Range   Color, Urine YELLOW YELLOW   APPearance CLEAR CLEAR   Specific Gravity, Urine 1.026 1.005 - 1.030   pH 6.0 5.0 - 8.0   Glucose, UA NEGATIVE NEGATIVE mg/dL   Hgb urine dipstick NEGATIVE NEGATIVE   Bilirubin Urine NEGATIVE  NEGATIVE   Ketones, ur NEGATIVE NEGATIVE mg/dL   Protein, ur NEGATIVE NEGATIVE mg/dL   Nitrite NEGATIVE NEGATIVE   Leukocytes,Ua NEGATIVE NEGATIVE   RBC / HPF 0-5 0 - 5 RBC/hpf   WBC, UA 0-5 0 - 5 WBC/hpf   Bacteria, UA NONE SEEN NONE SEEN   Squamous Epithelial / LPF 0-5 0 - 5   Mucus PRESENT     Comment: Performed at [facility], [address] [place], [place] [address]  Urine rapid drug screen (hosp performed)not at [facility]     Status: None   Collection Time: 06/02/19  6:26 PM  Result Value Ref Range   Opiates NONE DETECTED NONE DETECTED   Cocaine NONE DETECTED NONE DETECTED   Benzodiazepines NONE DETECTED NONE DETECTED   Amphetamines NONE DETECTED NONE DETECTED   Tetrahydrocannabinol NONE DETECTED NONE DETECTED   Barbiturates NONE DETECTED NONE DETECTED    Comment: (NOTE) DRUG SCREEN FOR MEDICAL PURPOSES ONLY.  IF CONFIRMATION IS NEEDED FOR ANY PURPOSE, NOTIFY LAB WITHIN 5 DAYS. LOWEST DETECTABLE LIMITS FOR URINE DRUG SCREEN Drug Class                     Cutoff (ng/mL) Amphetamine and metabolites    1000 Barbiturate and metabolites    200 Benzodiazepine                 200 Tricyclics and metabolites     300 Opiates and metabolites        300 Cocaine and metabolites        300 THC                            50 Performed at [facility], [address] [place], [place] [address]     Blood Alcohol level:  Lab Results  Component Value Date   ETH <10 05/11/2019    Metabolic Disorder Labs: Lab Results  Component Value Date   HGBA1C 4.0 (L) 05/13/2019   MPG 68.1 05/13/2019   Lab Results  Component Value Date   PROLACTIN 37.5 (H) 05/13/2019   Lab Results  Component Value Date   CHOL 180 05/13/2019   TRIG 38 05/13/2019   HDL 45 05/13/2019   CHOLHDL 4.0 05/13/2019   VLDL 8 05/13/2019   LDLCALC 127 (H) 05/13/2019    Physical Findings: AIMS: Facial and Oral Movements Muscles of Facial Expression: None, normal Lips and  Perioral Area: None, normal Jaw: None, normal Tongue: None, normal,Extremity Movements Upper (arms, wrists, hands, fingers): None, normal Lower (legs, knees, ankles, toes): None, normal, Trunk  Movements Neck, shoulders, hips: None, normal, Overall Severity Severity of abnormal movements (highest score from questions above): None, normal Incapacitation due to abnormal movements: None, normal Patient's awareness of abnormal movements (rate only patient's report): No Awareness, Dental Status Current problems with teeth and/or dentures?: No Does patient usually wear dentures?: No  CIWA:  CIWA-Ar Total: 0 COWS:  COWS Total Score: 0  Musculoskeletal: Strength & Muscle Tone: within normal limits Gait & Station: normal Patient leans: N/A  Psychiatric Specialty Exam: Physical Exam  Nursing note and vitals reviewed. Constitutional: He is oriented to person, place, and time. He appears well-developed and well-nourished. No distress.  Neck: Normal range of motion. Neck supple.  Respiratory: Effort normal.  Musculoskeletal: Normal range of motion.  Neurological: He is alert and oriented to person, place, and time.  Skin: Skin is warm and dry.  Psychiatric: Angry: Stable. His speech is delayed. Cognition and memory are normal. He expresses impulsivity. He exhibits a depressed mood.    Review of Systems  Psychiatric/Behavioral: Depression: Stable. Hallucinations: Denies. Suicidal ideas: Denies. Nervous/anxious: Denies. Insomnia: Denies.   All other systems reviewed and are negative.   Blood pressure (!) 91/56, pulse 67, temperature 98.6 F (37 C), temperature source Oral, resp. rate 18, height 5\' 8"  (1.727 m), weight 65.8 kg, SpO2 98 %.Body mass index is 22.05 kg/m.  General Appearance: Casual  Eye Contact:  Minimal  Speech:  Clear and Coherent and Slow  Volume:  Decreased  Mood:  Depressed   Improved; stable  Affect:  Congruent  Thought Process:  Coherent  Orientation:  Full (Time,  Place, and Person)  Thought Content:  Patient denies hallucinations, delusions, and paranoia, Patient does not appear to be responding to internal or external stimuli  Suicidal Thoughts:  No, denies  Homicidal Thoughts:  No, denies  Memory:  Immediate;   Fair Recent;   Fair  Judgement:  Fair  Insight:  Fair  Psychomotor Activity:  Normal  Concentration:  Concentration: Fair  Recall:  [employer] of Knowledge:  Fair  Language:  Fair  Akathisia:  No  Handed:  Right  AIMS (if indicated):     Assets:  Desire for Improvement Housing Social Support  ADL's:  Intact  Cognition:  WNL  Sleep:      Treatment Plan Summary: Patient has been psychiatrically cleared to follow up with outpatient psychiatric services.    Medication management:  Continue current medication regimen   Shuvon Rankin, NP 06/03/2019, 4:02 PM   Patient seen face-to-face for psychiatric evaluation, chart reviewed and case discussed with the physician extender and developed treatment plan. Reviewed the information documented and agree with the treatment plan.  Buford Dresser, DO 06/03/19 7:07 PM

## 2019-06-03 NOTE — BH Assessment (Addendum)
[facility] Assessment Progress Note  Per Buford Dresser, DO, this pt does not require psychiatric hospitalization at this time.  Pt presents under IVC initiated by Myles Lipps, MD which Dr Mariea Clonts has rescinded.  Pt is to be discharged from [facility] with recommendation to follow up with [facility]  This has been included in pt's discharge instructions.  Pt's nurse has been notified.  Jalene Mullet, MA Triage Specialist [phone]   Addendum:  At the request of Dr Mariea Clonts, this writer asked pt to sign Consent to Release Information to pt's father.  After pt did so, I called the father, [name] [name] [phone]).  Call was placed at 14:15.  The father is concerned about pt being discharged, fearing for his own safety.  He is convinced that pt will not follow up with outpatient treatment, and that pt is still mentally ill.  I explained to him that pt has been in the hospital for a week, that he has been seen by a psychiatrist every day, that his medications have been adjusted, that he does not present a danger to himself or others at this time, and that we therefore cannot legally detain him any longer.  I further explained that pt's mental illness is a chronic condition, and that he will need to receive outpatient treatment going forward.  I have discussed treatment options with the pt, including the [facility] Partial Hospitalization Program, which the pt has declined, as well as [facility], pt's regular outpatient provider.  I have also discussed this with the pt's father.  The father is resistant to coming to [facility] to pick pt up, and wants to speak to the pt first.  I attempted to facilitate this, but another pt is currently on the line.  Pt will be asked to call his father later.  In the meantime, the father has already called back in an effort to reach the pt.  Pt's nurse has been notified.  Jalene Mullet, [place] Coordinator [phone]

## 2019-06-03 NOTE — Progress Notes (Signed)
Spoke to Father Devian Bartolomei [phone], [phone]. Father agrees to pick patient up this evening at 1800.

## 2019-06-03 NOTE — Progress Notes (Signed)
D:  [name] [name] had been isolative to his room early evening.  He did get up later for snack and to sit in the day room to watch TV.  Thought blocking noted.  He denied SI/HI or A/V hallucinations.  He denied any pain or discomfort and appeared to be in no physical distress. A:  1:1 with RN for support and encouragement.  Medications as ordered.  Q 15 minute checks maintained for safety.  E R:  [name] [name] remains safe on the unit.  We will continue to monitor the progress towards his goals.

## 2019-06-03 NOTE — Progress Notes (Signed)
Pt. D/C from the Obs unit accompanied by father.  He was pleasant and cooperative. Voiced no SI/HI. D/C follow up paperwork reviewed with pt and copy sent as well as prescriptions.  Belongings (from locker 7) returned to pt. Q 15 min checks maintained until discharge.  Ion left the unit in no apparent distress.

## 2019-06-03 NOTE — Discharge Summary (Addendum)
[facility]  06/03/2019 4:12 PM [name] MinorsLancine [name]  MRN:  [id number]  Subjective: Patient states that he is doing good.  Patient reports that he is eating/sleeping without difficulty, tolerating medications without adverse reactions.   Patient seen face to face by this provider and Dr. Sharma CovertNorman; chart reviewed on 06/03/19.  Patient has improved; speech still delayed but has improved.  Patient has been in day room interacting with peers; there have been no behavioral or verbal outburst.  Patient has been compliant with medications and taken them as ordered.  Patient denies suicidal/self-harm/homicidal ideation, psychosis, and paranoia.  Patient spoke to his father today who has agreed to pick him up.     Principal Problem: Schizophrenia (HCC) Diagnosis: Principal Problem:   Schizophrenia (HCC)  Total Time spent with patient: 30 minutes  Past Psychiatric History: Schizophrenia  Past Medical History: History reviewed. No pertinent past medical history. History reviewed. No pertinent surgical history. Family History: History reviewed. No pertinent family history. Family Psychiatric  History: None per chart review.   Social History:  Social History   Substance and Sexual Activity  Alcohol Use No     Social History   Substance and Sexual Activity  Drug Use No    Social History   Socioeconomic History  . Marital status: Single    Spouse name: Not on file  . Number of children: Not on file  . Years of education: Not on file  . Highest education level: Not on file  Occupational History  . Not on file  Social Needs  . Financial resource strain: Not on file  . Food insecurity    Worry: Not on file    Inability: Not on file  . Transportation needs    Medical: Not on file    Non-medical: Not on file  Tobacco Use  . Smoking status: Never Smoker  . Smokeless tobacco: Never Used  Substance and Sexual Activity  . Alcohol use: No  . Drug use: No  . Sexual  activity: Never  Lifestyle  . Physical activity    Days per week: Not on file    Minutes per session: Not on file  . Stress: Not on file  Relationships  . Social Musicianconnections    Talks on phone: Not on file    Gets together: Not on file    Attends religious service: Not on file    Active member of club or organization: Not on file    Attends meetings of clubs or organizations: Not on file    Relationship status: Not on file  Other Topics Concern  . Not on file  Social History Narrative  . Not on file    Has this patient used any form of tobacco in the last 30 days? (Cigarettes, Smokeless Tobacco, Cigars, and/or Pipes) Prescription not provided because: Patient does not use tobacco products.  Current Medications: Current Facility-Administered Medications  Medication Dose Route Frequency Provider Last Rate Last Dose  . benztropine (COGENTIN) tablet 1 mg  1 mg Oral BID Rankin, Shuvon B, NP   1 mg at 06/03/19 1611  . FLUoxetine (PROZAC) capsule 20 mg  20 mg Oral Daily Rankin, Shuvon B, NP   20 mg at 06/03/19 1136  . haloperidol (HALDOL) tablet 10 mg  10 mg Oral BID Rankin, Shuvon B, NP   10 mg at 06/03/19 1611  . omega-3 acid ethyl esters (LOVAZA) capsule 1 g  1 g Oral BID Rankin, Shuvon B, NP   1 g at  06/03/19 1612  . traZODone (DESYREL) tablet 300 mg  300 mg Oral QHS Rankin, Shuvon B, NP   300 mg at 06/02/19 2307   PTA Medications: Medications Prior to Admission  Medication Sig Dispense Refill Last Dose  . benztropine (COGENTIN) 1 MG tablet Take 1 tablet (1 mg total) by mouth 2 (two) times daily. 60 tablet 3 Past Month at Unknown time  . FLUoxetine (PROZAC) 20 MG capsule Take 1 capsule (20 mg total) by mouth daily. 30 capsule 2 Past Month at Unknown time  . haloperidol (HALDOL) 5 MG tablet Take 1 tablet (5 mg total) by mouth 2 (two) times daily. 60 tablet 2 Past Month at Unknown time  . haloperidol decanoate (HALDOL DECANOATE) 100 MG/ML injection Inject 1 mL (100 mg total) into the  muscle every 28 (twenty-eight) days. Due 7/17 1 mL 11 Past Month at Unknown time  . omega-3 acid ethyl esters (LOVAZA) 1 g capsule Take 1 capsule (1 g total) by mouth 2 (two) times daily. 60 capsule 2 Past Month at Unknown time  . traZODone (DESYREL) 300 MG tablet Take 1 tablet (300 mg total) by mouth at bedtime. 30 tablet 2 Past Month at Unknown time    Musculoskeletal: Strength & Muscle Tone: within normal limits Gait & Station: normal Patient leans: N/A  Psychiatric Specialty Exam: Physical Exam  Nursing note and vitals reviewed. Constitutional: He is oriented to person, place, and time. He appears well-developed and well-nourished. No distress.  Neck: Normal range of motion. Neck supple.  Respiratory: Effort normal.  Musculoskeletal: Normal range of motion.  Neurological: He is alert and oriented to person, place, and time.  Skin: Skin is warm and dry.  Psychiatric: Angry: Stable. His speech is delayed. Cognition and memory are normal. He expresses impulsivity. He exhibits a depressed mood.    Review of Systems  Psychiatric/Behavioral: Depression: Stable. Hallucinations: Denies. Suicidal ideas: Denies. Nervous/anxious: Denies. Insomnia: Denies.   All other systems reviewed and are negative.   Blood pressure (!) 91/56, pulse 67, temperature 98.6 F (37 C), temperature source Oral, resp. rate 18, height 5\' 8"  (1.727 m), weight 65.8 kg, SpO2 98 %.Body mass index is 22.05 kg/m.  General Appearance: Casual  Eye Contact:  Fair  Speech:  Clear and Coherent and Slow  Volume:  Decreased  Mood:  Depressed   Improved; stable  Affect:  Congruent  Thought Process:  Coherent  Orientation:  Full (Time, Place, and Person)  Thought Content:  Patient denies hallucinations, delusions, and paranoia, Patient does not appear to be responding to internal or external stimuli  Suicidal Thoughts:  No, denies  Homicidal Thoughts:  No, denies  Memory:  Immediate;   Fair Recent;   Fair  Judgement:   Fair  Insight:  Fair  Psychomotor Activity:  Normal  Concentration:  Concentration: Fair  Recall:  [employer]  Fund of Knowledge:  Fair  Language:  Fair  Akathisia:  No  Handed:  Right  AIMS (if indicated):  N/A   Assets:  Desire for Improvement Housing Social Support  ADL's:  Intact  Cognition:  WNL  Sleep:   N/A    Demographic Factors:  Male and Unemployed  Loss Factors: NA  Historical Factors: Impulsivity  Risk Reduction Factors:   Religious beliefs about death, Living with another person, especially a relative and Positive social support  Continued Clinical Symptoms:  Previous Psychiatric Diagnoses and Treatments  Cognitive Features That Contribute To Risk:  Closed-mindedness    Suicide Risk:  Minimal: No identifiable  suicidal ideation.  Patients presenting with no risk factors but with morbid ruminations; may be classified as minimal risk based on the severity of the depressive symptoms   Plan Of Care/Follow-up recommendations:  Activity:  As tolerated  Diet:  Heart healthy Other:  Follow up with [facility]  Disposition: No evidence of imminent risk to self or others at present.   Patient does not meet criteria for psychiatric inpatient admission. Supportive therapy provided about ongoing stressors. Discussed crisis plan, support from social network, calling 911, coming to the Emergency Department, and calling Suicide Hotline.  Shuvon Rankin, NP 06/03/2019, 4:12 PM   Patient seen face-to-face for psychiatric evaluation, chart reviewed and case discussed with the physician extender and developed treatment plan. Reviewed the information documented and agree with the treatment plan.  Buford Dresser, DO 06/03/19 7:06 PM

## 2019-06-03 NOTE — Progress Notes (Signed)
[name] [name] NOVEL CORONAVIRUS (COVID-19) DAILY CHECK-OFF SYMPTOMS - answer yes or no to each - every day NO YES  Have you had a fever in the past 24 hours?  . Fever (Temp > 37.80C / 100F) X   Have you had any of these symptoms in the past 24 hours? . New Cough .  Sore Throat  .  Shortness of Breath .  Difficulty Breathing .  Unexplained Body Aches   X   Have you had any one of these symptoms in the past 24 hours not related to allergies?   . Runny Nose .  Nasal Congestion .  Sneezing   X   If you have had runny nose, nasal congestion, sneezing in the past 24 hours, has it worsened?  X   EXPOSURES - check yes or no X   Have you traveled outside the state in the past 14 days?  X   Have you been in contact with someone with a confirmed diagnosis of COVID-19 or PUI in the past 14 days without wearing appropriate PPE?  X   Have you been living in the same home as a person with confirmed diagnosis of COVID-19 or a PUI (household contact)?    X   Have you been diagnosed with COVID-19?    X              What to do next: Answered NO to all: Answered YES to anything:   Proceed with unit schedule Follow the [facility] Inpatient Flowsheet.   

## 2019-06-08 ENCOUNTER — Emergency Department (HOSPITAL_COMMUNITY)
Admission: EM | Admit: 2019-06-08 | Discharge: 2019-06-08 | Discharge status: Against Medical Advice | Payer: BC Managed Care – PPO | Attending: Emergency Medicine | Admitting: Emergency Medicine

## 2019-06-08 ENCOUNTER — Other Ambulatory Visit: Payer: Self-pay

## 2019-06-08 ENCOUNTER — Encounter (HOSPITAL_COMMUNITY): Payer: Self-pay | Admitting: Emergency Medicine

## 2019-06-08 DIAGNOSIS — Z5321 Procedure and treatment not carried out due to patient leaving prior to being seen by health care provider: Secondary | ICD-10-CM | POA: Insufficient documentation

## 2019-06-08 DIAGNOSIS — R4689 Other symptoms and signs involving appearance and behavior: Secondary | ICD-10-CM | POA: Diagnosis present

## 2019-06-08 HISTORY — DX: Schizophrenia, unspecified: F20.9

## 2019-06-08 LAB — COMPREHENSIVE METABOLIC PANEL
ALT: 13 U/L (ref 0–44)
AST: 17 U/L (ref 15–41)
Albumin: 4.4 g/dL (ref 3.5–5.0)
Alkaline Phosphatase: 46 U/L (ref 38–126)
Anion gap: 10 (ref 5–15)
BUN: 9 mg/dL (ref 6–20)
CO2: 25 mmol/L (ref 22–32)
Calcium: 9.5 mg/dL (ref 8.9–10.3)
Chloride: 105 mmol/L (ref 98–111)
Creatinine, Ser: 0.92 mg/dL (ref 0.61–1.24)
GFR calc Af Amer: >60 mL/min (ref >60)
GFR calc non Af Amer: >60 mL/min (ref >60)
Glucose, Bld: 91 mg/dL (ref 70–99)
Potassium: 3.4 mmol/L — ABNORMAL LOW (ref 3.5–5.1)
Sodium: 140 mmol/L (ref 135–145)
Total Bilirubin: 1.3 mg/dL — ABNORMAL HIGH (ref 0.3–1.2)
Total Protein: 6.8 g/dL (ref 6.5–8.1)

## 2019-06-08 LAB — CBC
HCT: 42.1 % (ref 39.0–52.0)
Hemoglobin: 14 g/dL (ref 13.0–17.0)
MCH: 33 pg (ref 26.0–34.0)
MCHC: 33.3 g/dL (ref 30.0–36.0)
MCV: 99.3 fL (ref 80.0–100.0)
Platelets: 325 10*3/uL (ref 150–400)
RBC: 4.24 MIL/uL (ref 4.22–5.81)
RDW: 11.7 % (ref 11.5–15.5)
WBC: 6 10*3/uL (ref 4.0–10.5)
nRBC: 0 % (ref 0.0–0.2)

## 2019-06-08 LAB — ACETAMINOPHEN LEVEL: Acetaminophen (Tylenol), Serum: <10 ug/mL — ABNORMAL LOW (ref 10–30)

## 2019-06-08 LAB — SALICYLATE LEVEL: Salicylate Lvl: <7 mg/dL (ref 2.8–30.0)

## 2019-06-08 LAB — ETHANOL: Alcohol, Ethyl (B): <10 mg/dL (ref <10)

## 2019-06-08 NOTE — ED Notes (Signed)
Pt given purple scrubs and socks to change into, pt came out of bathroom stating he wanted to leave and 'not do this right now'. Pt was encourage to stay and talk to nurse. Pt is waiting to talk to nurse.

## 2019-06-08 NOTE — ED Notes (Signed)
Pt will not stay in ED to be evaluated. Pt walked to waiting area to call father. I spoke with father he was told to bring him to ED for IVC, pt is calm and cooperative and denies SI at this time. Attempts made to explain to father we have no basis for an IVC. Father reports he attempted to gain IVC was denied and told to bring him to the ED.

## 2019-06-08 NOTE — ED Notes (Signed)
Pt unable to void at this time,  Pt given urinal.

## 2019-06-08 NOTE — ED Triage Notes (Signed)
Pt brought to ED by father after being released from jail after "scuffle" with sister. Pt denies injury to her or himself. Pt denies SI, denies A/V Hallucinations.  Pt d/c from [facility] last Tuesday.  Pt states he has not been taking medication since returning home.

## 2019-06-08 NOTE — ED Notes (Signed)
Pt refused to stay in room to be seen.  Off-duty [employer] officer spoke with pt's dad who is likely going back to magistrate to try to take out IVC papers.

## 2019-06-09 ENCOUNTER — Ambulatory Visit (HOSPITAL_COMMUNITY)
Admission: RE | Admit: 2019-06-09 | Discharge: 2019-06-09 | Disposition: A | Discharge status: Home / Self Care | Payer: BC Managed Care – PPO | Attending: Psychiatry | Admitting: Psychiatry

## 2019-06-09 DIAGNOSIS — F250 Schizoaffective disorder, bipolar type: Secondary | ICD-10-CM | POA: Insufficient documentation
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
